# Patient Record
Sex: Male | Born: 1960 | Hispanic: No | Smoking: Former smoker
Health system: Southern US, Community
[De-identification: ages and names within clinical notes are randomized; demographics above are authoritative.]

## PROBLEM LIST (undated history)

## (undated) DIAGNOSIS — E119 Type 2 diabetes mellitus without complications: Secondary | ICD-10-CM

## (undated) DIAGNOSIS — B1920 Unspecified viral hepatitis C without hepatic coma: Secondary | ICD-10-CM

## (undated) DIAGNOSIS — E7800 Pure hypercholesterolemia, unspecified: Secondary | ICD-10-CM

## (undated) DIAGNOSIS — I10 Essential (primary) hypertension: Secondary | ICD-10-CM

## (undated) HISTORY — PX: OTHER SURGICAL HISTORY: SHX169

---

## 2003-10-08 ENCOUNTER — Other Ambulatory Visit: Payer: Self-pay

## 2007-10-24 ENCOUNTER — Ambulatory Visit: Payer: Self-pay | Admitting: Gastroenterology

## 2007-11-05 ENCOUNTER — Ambulatory Visit: Payer: Self-pay | Admitting: Gastroenterology

## 2008-12-22 ENCOUNTER — Inpatient Hospital Stay: Payer: Self-pay | Admitting: Orthopedic Surgery

## 2009-01-06 ENCOUNTER — Ambulatory Visit: Payer: Self-pay | Admitting: Orthopedic Surgery

## 2009-01-07 ENCOUNTER — Inpatient Hospital Stay: Payer: Self-pay | Admitting: Orthopedic Surgery

## 2009-05-06 ENCOUNTER — Ambulatory Visit: Payer: Self-pay | Admitting: Orthopedic Surgery

## 2009-05-06 ENCOUNTER — Ambulatory Visit: Payer: Self-pay | Admitting: Cardiovascular Disease

## 2009-05-06 ENCOUNTER — Emergency Department: Payer: Self-pay | Admitting: Emergency Medicine

## 2009-05-13 ENCOUNTER — Ambulatory Visit: Payer: Self-pay | Admitting: Orthopedic Surgery

## 2009-06-10 ENCOUNTER — Ambulatory Visit: Payer: Self-pay | Admitting: Orthopedic Surgery

## 2009-12-15 ENCOUNTER — Ambulatory Visit: Payer: Self-pay | Admitting: Orthopedic Surgery

## 2009-12-21 ENCOUNTER — Ambulatory Visit: Payer: Self-pay | Admitting: Orthopedic Surgery

## 2010-05-07 ENCOUNTER — Observation Stay: Payer: Self-pay | Admitting: Internal Medicine

## 2010-07-19 IMAGING — CR DG TIBIA/FIBULA 2V*L*
1 series · 4 of 4 positions shown · non-contrast
Comparison: none

REASON FOR EXAM: pain trauma
COMMENTS:

PROCEDURE:     DXR - DXR TIBIA AND FIBULA LT (LOWER L  - December 22, 2008 [DATE]
RESULT:     Comminuted, proximal fibular fracture is appreciated which
appears to be nondisplaced. There are findings which appear to represent an
exostosis along the lateral tibial plateau.

[Series 1: view not recorded · 0.17mm/px · 4 of 4 slices shown]
[im 1/4]
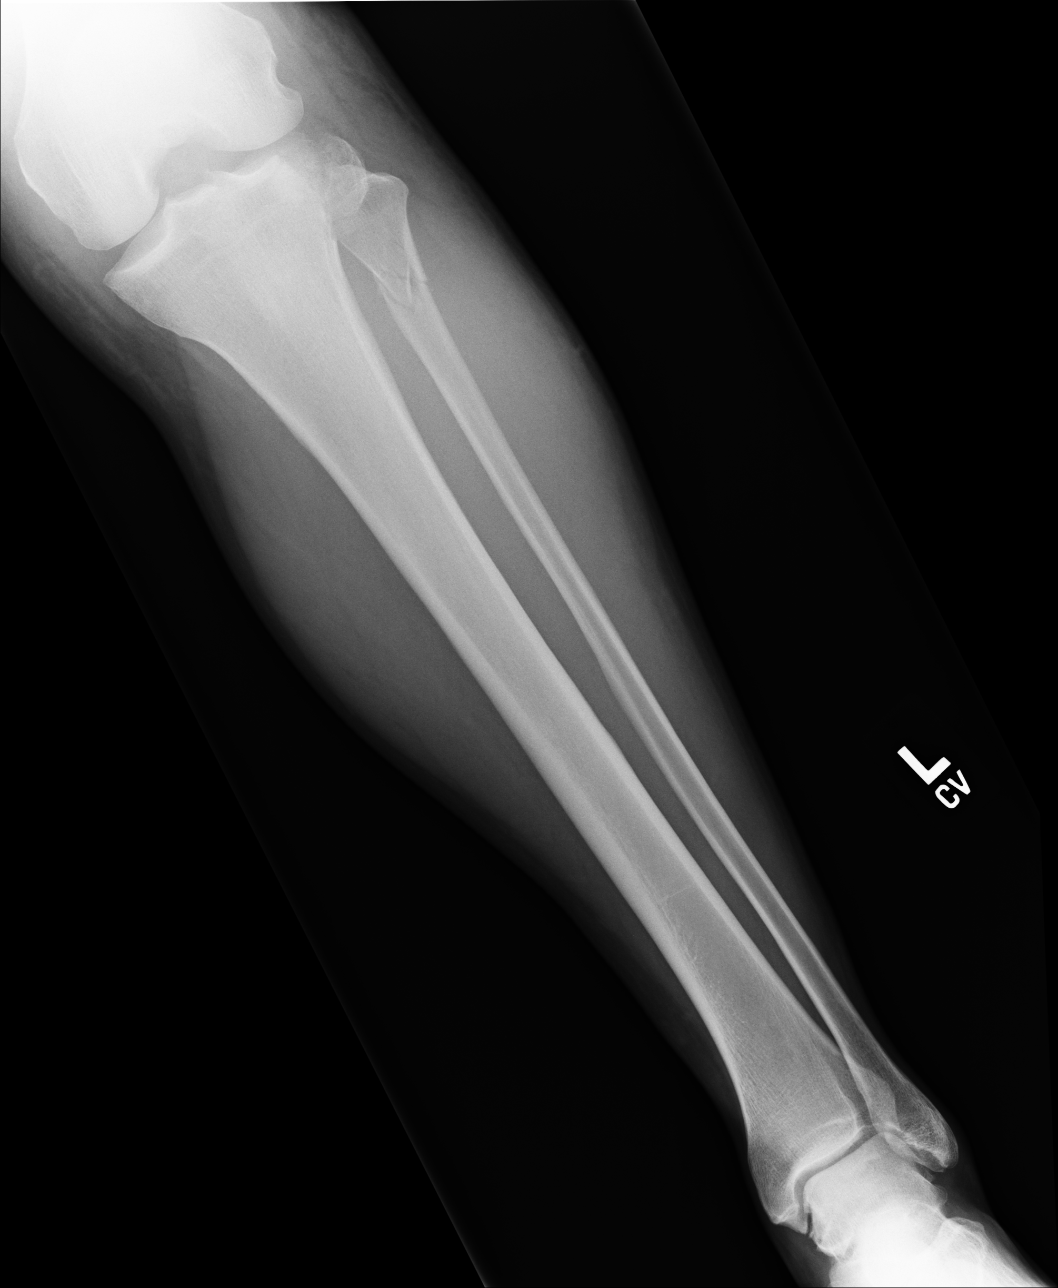
[im 2/4]
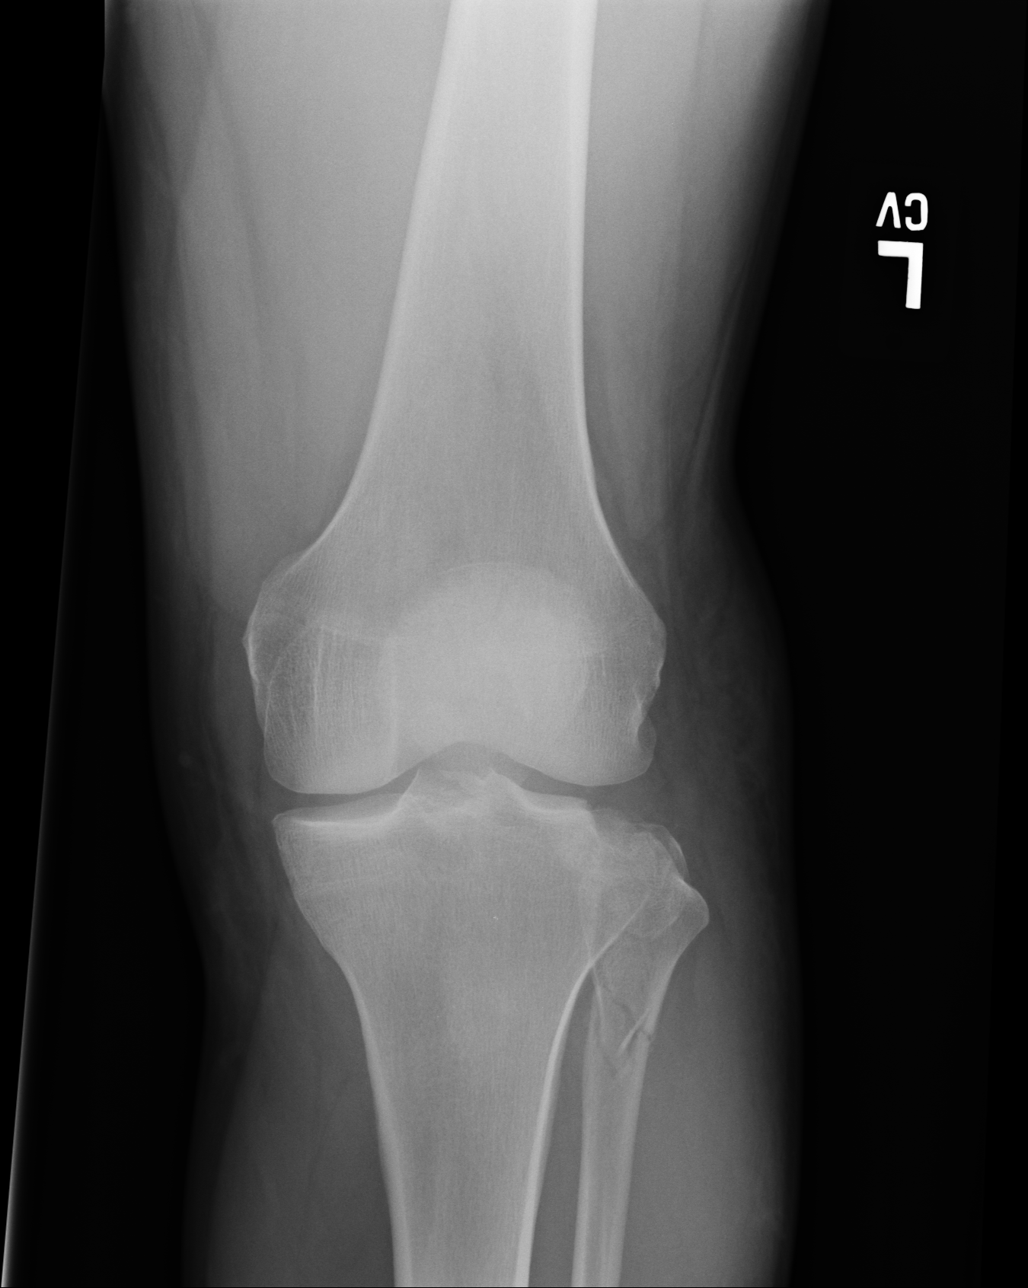
[im 3/4]
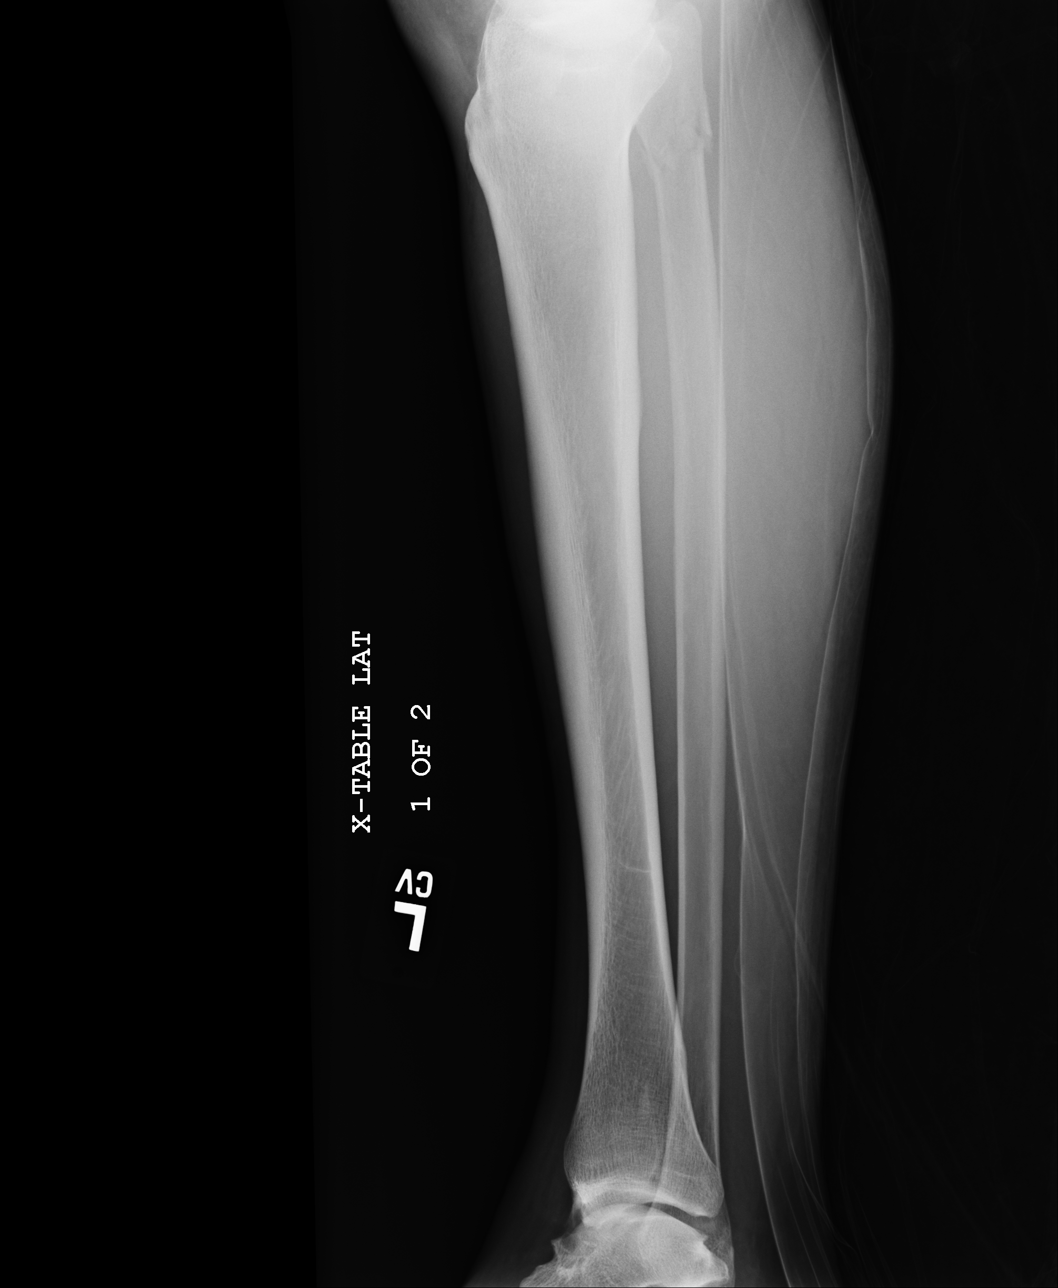
[im 4/4]
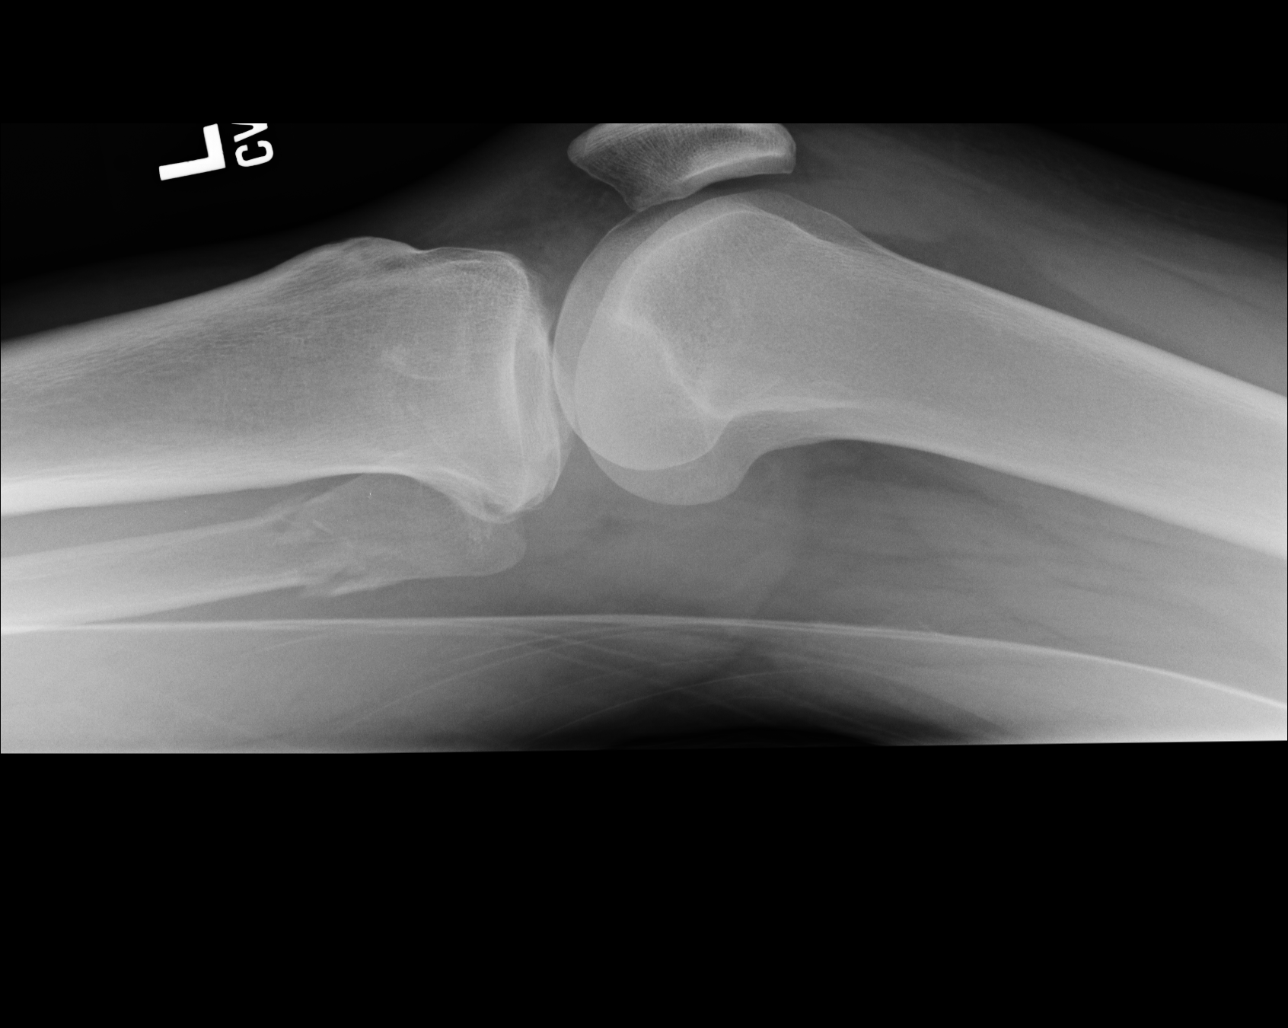

[4 of 4 positions shown; findings below may reference images not displayed]

IMPRESSION: Proximal fibular fracture as described above.

## 2010-07-19 IMAGING — CT CT OF THE LEFT KNEE WITHOUT CONTRAST
2 series · 15 of 20 positions shown, 18 images · non-contrast
Comparison: none

REASON FOR EXAM: knee pain   700 pound crush to knee
COMMENTS:

[Series 2: extremity 3.0 b70s · axial · 0.33mm/px · z∈[+42,+174]mm · 12 of 54 slices shown, 15 images]
[im 5/54  soft-tissue]
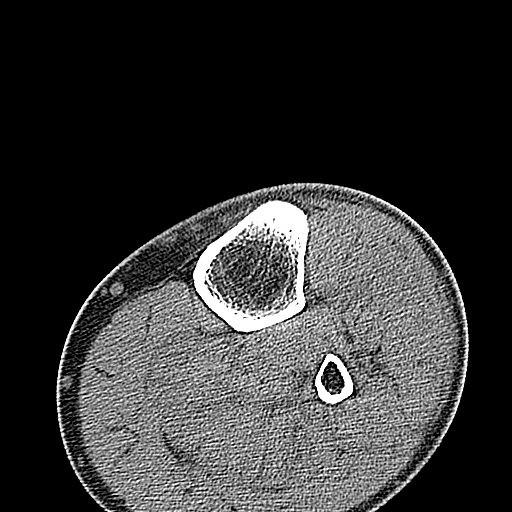
[im 5/54  bone]
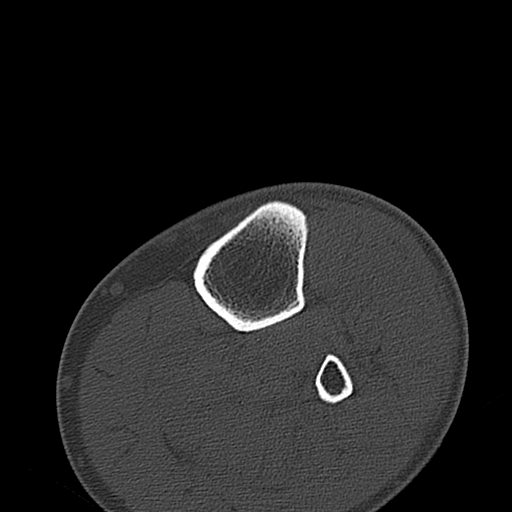
[im 9/54  bone]
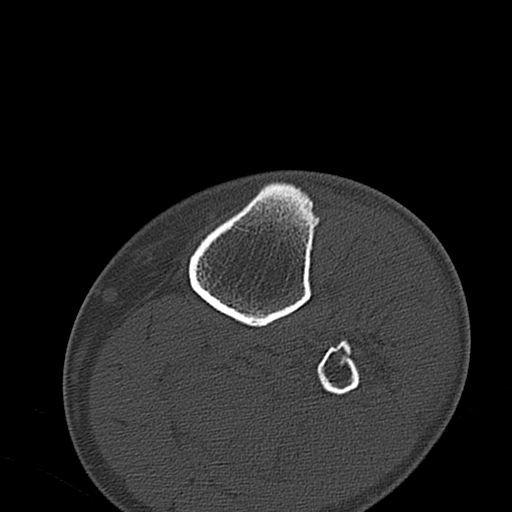
[im 13/54  bone]
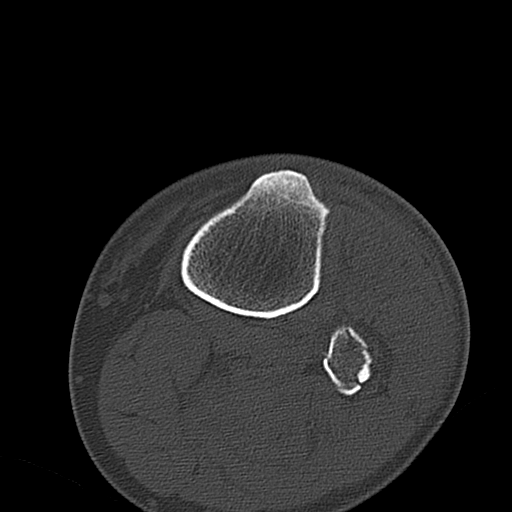
[im 17/54  bone]
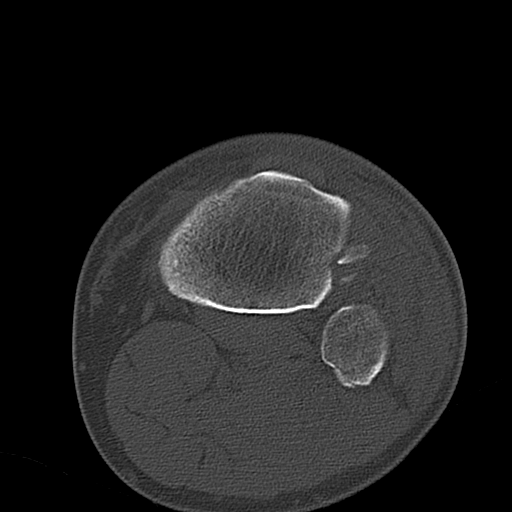
[im 21/54  soft-tissue]
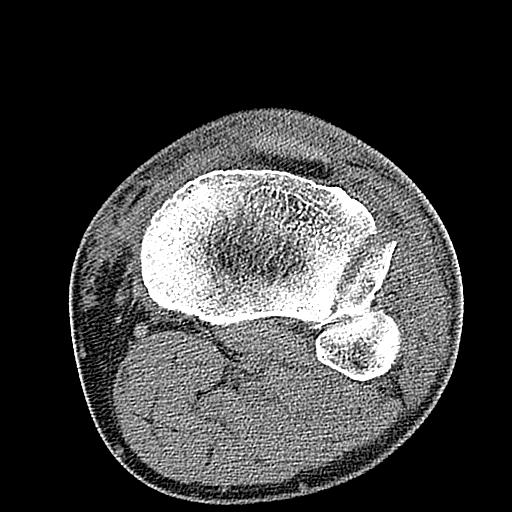
[im 21/54  bone]
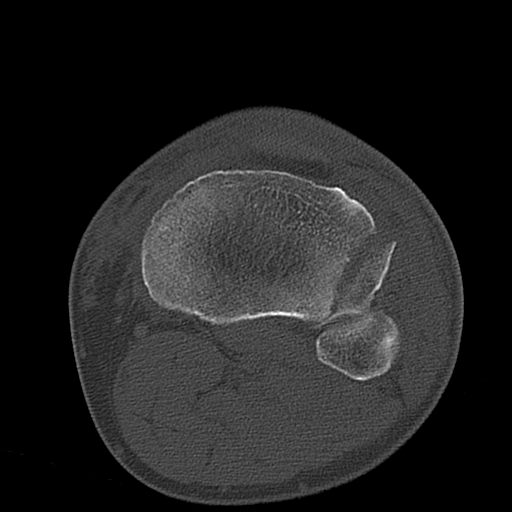
[im 25/54  bone]
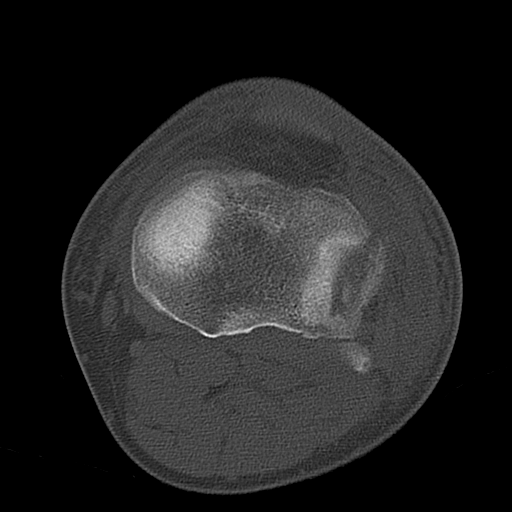
[im 29/54  bone]
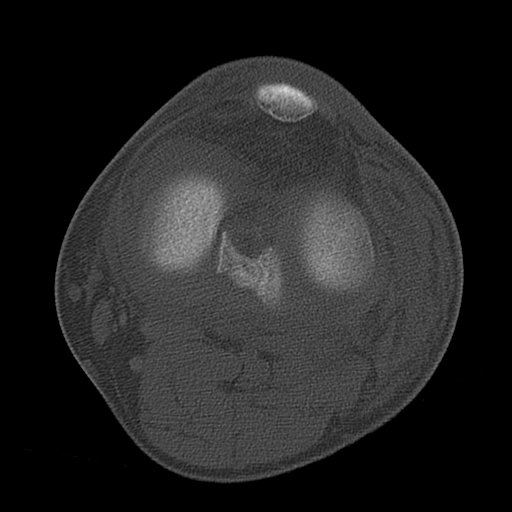
[im 33/54  bone]
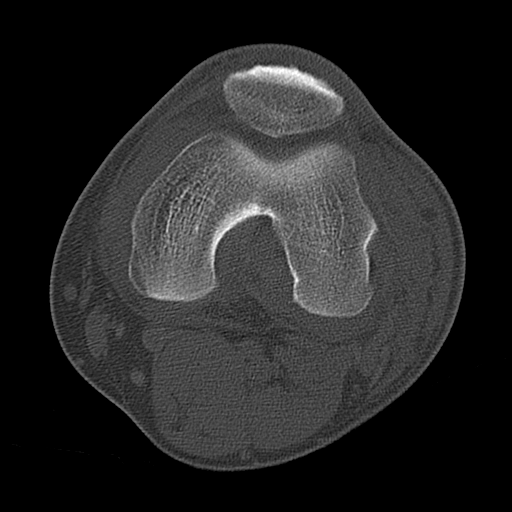
[im 37/54  soft-tissue]
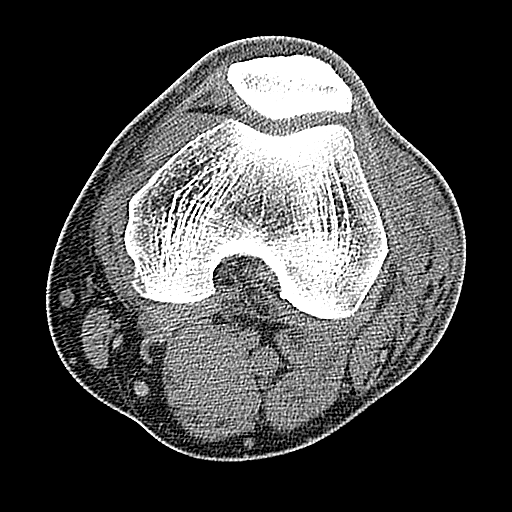
[im 37/54  bone]
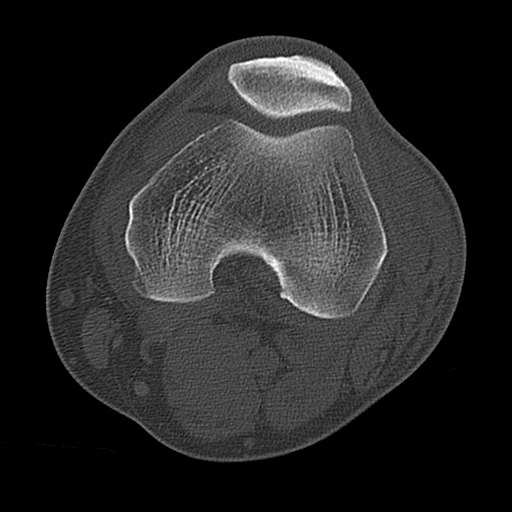
[im 41/54  bone]
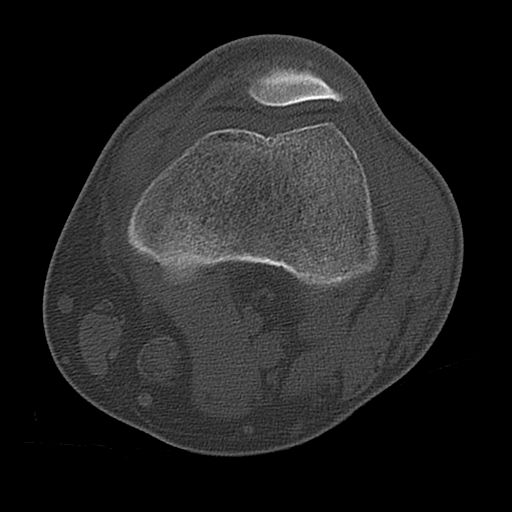
[im 45/54  bone]
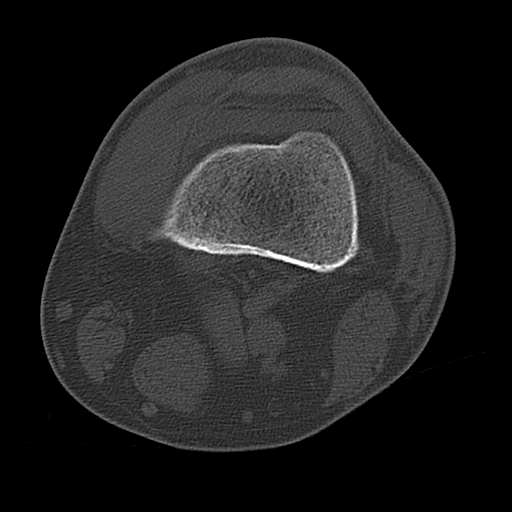
[im 49/54  bone]
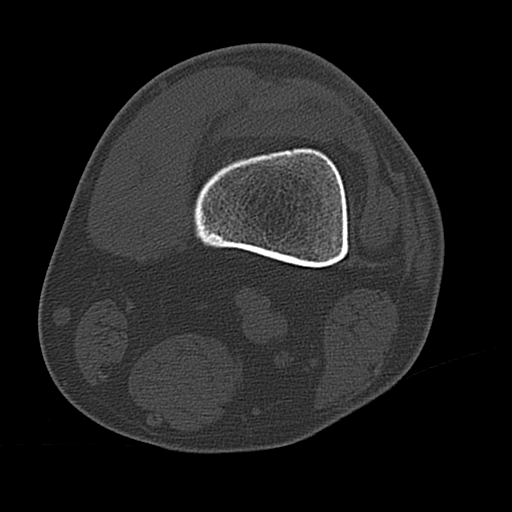

[Series 5: coronal · coronal · 0.35mm/px · 3 of 55 slices shown]
[im 11/55  bone]
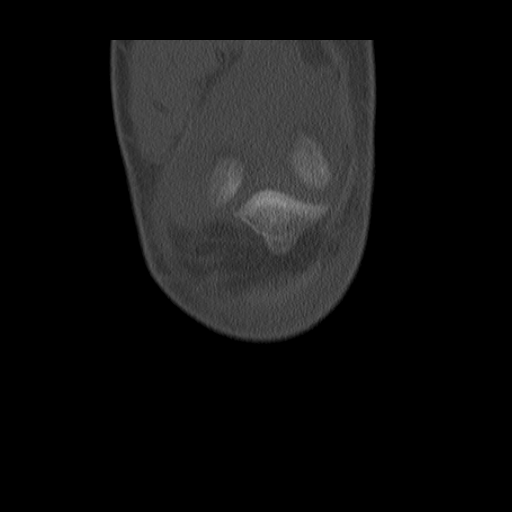
[im 22/55  bone]
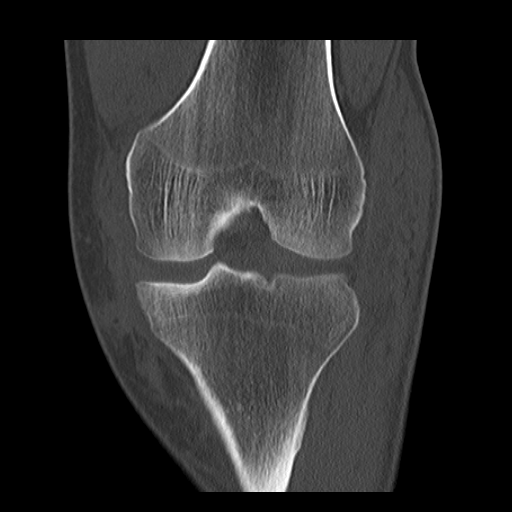
[im 33/55  bone]
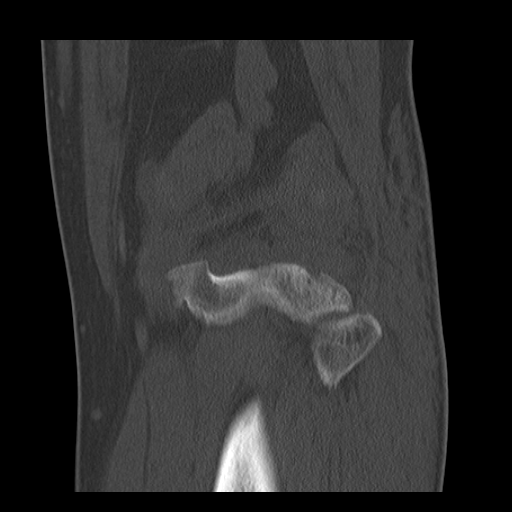

[15 of 20 positions shown; findings below may reference images not displayed]

PROCEDURE:     CT  - CT KNEE LEFT WO  - December 22, 2008 [DATE]

RESULT:     The multiplanar reconstructions are performed following
multislice helical acquisition through the left knee. There is fracture
along the lateral and posterior aspect of the lateral tibial plateau with a
primarily vertical and anterior to posterior orientation. There is
distraction of fracture fragments laterally by approximately 8 mm. There is
discontinuity in the tibial plateau. There is a proximal left fibular
fracture. There does not appear to be significant fibular distraction. There
is some minimal comminution in the fibular and tibial fractures. The distal
femur appears intact. The patella appears intact. No additional fracture is
evident.
IMPRESSION: 1. Fractures in the lateral tibial plateau lateral region with a primarily
anterior-posterior and vertical orientation with distraction and mild
comminution. Fracture also in the proximal left fibula as described.

## 2010-11-28 ENCOUNTER — Ambulatory Visit: Payer: Self-pay | Admitting: Orthopedic Surgery

## 2010-12-06 ENCOUNTER — Inpatient Hospital Stay: Payer: Self-pay | Admitting: Orthopedic Surgery

## 2010-12-08 LAB — PATHOLOGY REPORT

## 2012-05-20 ENCOUNTER — Ambulatory Visit: Payer: Self-pay

## 2015-11-25 ENCOUNTER — Emergency Department: Payer: Self-pay

## 2015-11-25 ENCOUNTER — Inpatient Hospital Stay
Admission: EM | Admit: 2015-11-25 | Discharge: 2015-11-27 | DRG: 370 | Disposition: A | Discharge status: Home / Self Care | Payer: Self-pay | Attending: Internal Medicine | Admitting: Internal Medicine

## 2015-11-25 DIAGNOSIS — Z96652 Presence of left artificial knee joint: Secondary | ICD-10-CM | POA: Diagnosis present

## 2015-11-25 DIAGNOSIS — K922 Gastrointestinal hemorrhage, unspecified: Secondary | ICD-10-CM | POA: Diagnosis present

## 2015-11-25 DIAGNOSIS — Z6830 Body mass index (BMI) 30.0-30.9, adult: Secondary | ICD-10-CM

## 2015-11-25 DIAGNOSIS — K921 Melena: Secondary | ICD-10-CM | POA: Insufficient documentation

## 2015-11-25 DIAGNOSIS — Z9119 Patient's noncompliance with other medical treatment and regimen: Secondary | ICD-10-CM

## 2015-11-25 DIAGNOSIS — K920 Hematemesis: Secondary | ICD-10-CM | POA: Insufficient documentation

## 2015-11-25 DIAGNOSIS — K2970 Gastritis, unspecified, without bleeding: Secondary | ICD-10-CM | POA: Diagnosis present

## 2015-11-25 DIAGNOSIS — R739 Hyperglycemia, unspecified: Secondary | ICD-10-CM

## 2015-11-25 DIAGNOSIS — I10 Essential (primary) hypertension: Secondary | ICD-10-CM | POA: Diagnosis present

## 2015-11-25 DIAGNOSIS — B1920 Unspecified viral hepatitis C without hepatic coma: Secondary | ICD-10-CM | POA: Diagnosis present

## 2015-11-25 DIAGNOSIS — E1165 Type 2 diabetes mellitus with hyperglycemia: Secondary | ICD-10-CM | POA: Diagnosis present

## 2015-11-25 DIAGNOSIS — K226 Gastro-esophageal laceration-hemorrhage syndrome: Principal | ICD-10-CM | POA: Diagnosis present

## 2015-11-25 DIAGNOSIS — E7800 Pure hypercholesterolemia, unspecified: Secondary | ICD-10-CM | POA: Diagnosis present

## 2015-11-25 DIAGNOSIS — F17210 Nicotine dependence, cigarettes, uncomplicated: Secondary | ICD-10-CM | POA: Diagnosis present

## 2015-11-25 DIAGNOSIS — K2980 Duodenitis without bleeding: Secondary | ICD-10-CM | POA: Diagnosis present

## 2015-11-25 HISTORY — DX: Unspecified viral hepatitis C without hepatic coma: B19.20

## 2015-11-25 HISTORY — DX: Type 2 diabetes mellitus without complications: E11.9

## 2015-11-25 HISTORY — DX: Essential (primary) hypertension: I10

## 2015-11-25 HISTORY — DX: Pure hypercholesterolemia, unspecified: E78.00

## 2015-11-25 LAB — URINALYSIS COMPLETE WITH MICROSCOPIC (ARMC ONLY)
BACTERIA UA: NONE SEEN
Bilirubin Urine: NEGATIVE
Glucose, UA: >500 mg/dL — AB
HGB URINE DIPSTICK: NEGATIVE
KETONES UR: NEGATIVE mg/dL
LEUKOCYTES UA: NEGATIVE
NITRITE: NEGATIVE
PH: 5 (ref 5.0–8.0)
PROTEIN: NEGATIVE mg/dL
SPECIFIC GRAVITY, URINE: 1.026 (ref 1.005–1.030)

## 2015-11-25 LAB — COMPREHENSIVE METABOLIC PANEL
ALT: 56 U/L (ref 17–63)
ANION GAP: 11 (ref 5–15)
AST: 38 U/L (ref 15–41)
Albumin: 4 g/dL (ref 3.5–5.0)
Alkaline Phosphatase: 91 U/L (ref 38–126)
BILIRUBIN TOTAL: 0.9 mg/dL (ref 0.3–1.2)
BUN: 41 mg/dL — AB (ref 6–20)
CHLORIDE: 100 mmol/L — AB (ref 101–111)
CO2: 23 mmol/L (ref 22–32)
Calcium: 9 mg/dL (ref 8.9–10.3)
Creatinine, Ser: 1.07 mg/dL (ref 0.61–1.24)
Glucose, Bld: 504 mg/dL (ref 65–99)
POTASSIUM: 4.7 mmol/L (ref 3.5–5.1)
Sodium: 134 mmol/L — ABNORMAL LOW (ref 135–145)
TOTAL PROTEIN: 7.5 g/dL (ref 6.5–8.1)

## 2015-11-25 LAB — CBC WITH DIFFERENTIAL/PLATELET
BASOS ABS: 0.1 10*3/uL (ref 0–0.1)
Basophils Relative: 0 %
EOS PCT: 0 %
Eosinophils Absolute: 0 10*3/uL (ref 0–0.7)
HCT: 41.7 % (ref 40.0–52.0)
HEMOGLOBIN: 14.1 g/dL (ref 13.0–18.0)
LYMPHS ABS: 2.3 10*3/uL (ref 1.0–3.6)
LYMPHS PCT: 13 %
MCH: 29.4 pg (ref 26.0–34.0)
MCHC: 33.8 g/dL (ref 32.0–36.0)
MCV: 87 fL (ref 80.0–100.0)
Monocytes Absolute: 1.1 10*3/uL — ABNORMAL HIGH (ref 0.2–1.0)
Monocytes Relative: 6 %
NEUTROS ABS: 13.8 10*3/uL — AB (ref 1.4–6.5)
NEUTROS PCT: 81 %
PLATELETS: 325 10*3/uL (ref 150–440)
RBC: 4.79 MIL/uL (ref 4.40–5.90)
RDW: 12.9 % (ref 11.5–14.5)
WBC: 17.1 10*3/uL — AB (ref 3.8–10.6)

## 2015-11-25 LAB — ABO/RH: ABO/RH(D): A NEG

## 2015-11-25 LAB — PROTIME-INR
INR: 1.12
PROTHROMBIN TIME: 14.6 s (ref 11.4–15.0)

## 2015-11-25 LAB — LIPASE, BLOOD: Lipase: 14 U/L (ref 11–51)

## 2015-11-25 LAB — TYPE AND SCREEN
ABO/RH(D): A NEG
Antibody Screen: NEGATIVE

## 2015-11-25 LAB — GLUCOSE, CAPILLARY: Glucose-Capillary: 274 mg/dL — ABNORMAL HIGH (ref 65–99)

## 2015-11-25 LAB — APTT: aPTT: <24 seconds — ABNORMAL LOW (ref 24–36)

## 2015-11-25 MED ORDER — MORPHINE SULFATE (PF) 4 MG/ML IV SOLN
4.0000 mg | Freq: Once | INTRAVENOUS | Status: AC
  Administered 2015-11-25: 4 mg via INTRAVENOUS
  Filled 2015-11-25: qty 1

## 2015-11-25 MED ORDER — ONDANSETRON HCL 4 MG/2ML IJ SOLN
4.0000 mg | Freq: Once | INTRAMUSCULAR | Status: AC
  Administered 2015-11-25: 4 mg via INTRAVENOUS
  Filled 2015-11-25: qty 2

## 2015-11-25 MED ORDER — SODIUM CHLORIDE 0.9 % IV BOLUS (SEPSIS)
1000.0000 mL | INTRAVENOUS | Status: AC
  Administered 2015-11-25: 1000 mL via INTRAVENOUS

## 2015-11-25 MED ORDER — SODIUM CHLORIDE 0.9 % IV BOLUS (SEPSIS)
1000.0000 mL | Freq: Once | INTRAVENOUS | Status: AC
  Administered 2015-11-25: 1000 mL via INTRAVENOUS

## 2015-11-25 MED ORDER — PANTOPRAZOLE SODIUM 40 MG IV SOLR
80.0000 mg | Freq: Once | INTRAVENOUS | Status: AC
  Administered 2015-11-25: 80 mg via INTRAVENOUS
  Filled 2015-11-25: qty 80

## 2015-11-25 MED ORDER — HYDRALAZINE HCL 20 MG/ML IJ SOLN
10.0000 mg | Freq: Four times a day (QID) | INTRAMUSCULAR | Status: DC | PRN
Start: 2015-11-25 — End: 2015-11-27

## 2015-11-25 MED ORDER — SODIUM CHLORIDE 0.9 % IV SOLN
3.0000 g | INTRAVENOUS | Status: AC
  Administered 2015-11-25: 3 g via INTRAVENOUS
  Filled 2015-11-25: qty 3

## 2015-11-25 MED ORDER — INSULIN ASPART 100 UNIT/ML ~~LOC~~ SOLN
20.0000 [IU] | Freq: Once | SUBCUTANEOUS | Status: AC
  Administered 2015-11-25: 20 [IU] via SUBCUTANEOUS
  Filled 2015-11-25: qty 20

## 2015-11-25 MED ORDER — SODIUM CHLORIDE 0.9 % IV SOLN
8.0000 mg/h | INTRAVENOUS | Status: DC
  Administered 2015-11-25 – 2015-11-26 (×3): 8 mg/h via INTRAVENOUS
  Filled 2015-11-25 (×3): qty 80

## 2015-11-25 MED ORDER — SODIUM CHLORIDE 0.9 % IV SOLN
INTRAVENOUS | Status: DC
  Administered 2015-11-25: 21:00:00 via INTRAVENOUS

## 2015-11-25 NOTE — ED Notes (Signed)
Pharmacy called and made aware of need of Unasyn. States they will send it.

## 2015-11-25 NOTE — ED Notes (Signed)
Patient transported to X-ray 

## 2015-11-25 NOTE — H&P (Signed)
[facility] [place] NAME: Adam Thomas    MR#:  [id number]  DATE OF BIRTH:  [birth year]  DATE OF ADMISSION:  11/25/2015  PRIMARY CARE PHYSICIAN: No primary care provider on file.   REQUESTING/REFERRING PHYSICIAN: dr Karma Greaser  CHIEF COMPLAINT:   Vomiting maroon blood since last night 3 so far and black tarry stools HISTORY OF PRESENT ILLNESS:  Adam Thomas  is a 55 y.o. male with a known history of hepatitis C, hypertension, type 2 diabetes, tobacco abuse not taking any medications or following up with primary care physician comes to the emergency room after he had some pork chops last night. Patient started vomiting at 2 AM and had vomited blood at work second time. Came home went to bed and woke up and in the evening around 8:30 had another episode of dark maroon vomiting. He started having or should have a bowel movement and noticed black tarry stools. Came to the emergency room he is hemodynamically stable with hemoglobin normal hemoglobin how were is being admitted now with a GI bleed for further evaluation management. Patient currently is off Protonix drip. Patient reports taking Aleve PM 3 tablets daily at bedtime. Denies any other over-the-counter or any other end-stage use.   PAST MEDICAL HISTORY:   Past Medical History  Diagnosis Date  . Diabetes mellitus without complication ([place])   . Hypertension   . Hepatitis C   . High cholesterol     PAST SURGICAL HISTOIRY:   Past Surgical History  Procedure Laterality Date  . Left knee replacement Left     SOCIAL HISTORY:   Social History  Substance Use Topics  . Smoking status: Current Every Day Smoker -- 1.00 packs/day for 35 years    Types: Cigarettes  . Smokeless tobacco: Not on file  . Alcohol Use: No    FAMILY HISTORY:  No family history on file.  DRUG ALLERGIES:  No Known Allergies  REVIEW OF SYSTEMS:  Review of Systems  Constitutional: Negative for  fever, chills and weight loss.  HENT: Negative for ear discharge, ear pain and nosebleeds.   Eyes: Negative for blurred vision, pain and discharge.  Respiratory: Negative for sputum production, shortness of breath, wheezing and stridor.   Cardiovascular: Negative for chest pain, palpitations, orthopnea and PND.  Gastrointestinal: Positive for vomiting, abdominal pain, blood in stool and melena. Negative for nausea and diarrhea.  Genitourinary: Negative for urgency and frequency.  Musculoskeletal: Negative for back pain and joint pain.  Neurological: Positive for weakness. Negative for sensory change, speech change and focal weakness.  Psychiatric/Behavioral: Negative for depression and hallucinations. The patient is not nervous/anxious.   All other systems reviewed and are negative.    MEDICATIONS AT HOME:   Prior to Admission medications   Not on File      VITAL SIGNS:  Blood pressure 140/100, pulse 102, temperature 98.3 F (36.8 C), temperature source Oral, resp. rate 20, SpO2 98 %.  PHYSICAL EXAMINATION:  GENERAL:  55 y.o.-year-old patient lying in the bed with no acute distress. Morbid obesity  EYES: Pupils equal, round, reactive to light and accommodation. No scleral icterus. Extraocular muscles intact.  HEENT: Head atraumatic, normocephalic. Oropharynx and nasopharynx clear.  NECK:  Supple, no jugular venous distention. No thyroid enlargement, no tenderness.  LUNGS: Normal breath sounds bilaterally, no wheezing, rales,rhonchi or crepitation. No use of accessory muscles of respiration.  CARDIOVASCULAR: S1, S2 normal. No murmurs, rubs, or gallops.  ABDOMEN: Soft, nontender, nondistended.  Bowel sounds present. No organomegaly or mass.  EXTREMITIES: No pedal edema, cyanosis, or clubbing.  NEUROLOGIC: Cranial nerves II through XII are intact. Muscle strength 5/5 in all extremities. Sensation intact. Gait not checked.  PSYCHIATRIC: The patient is alert and oriented x 3.  SKIN: No  obvious rash, lesion, or ulcer.   LABORATORY PANEL:   CBC  Recent Labs Lab 11/25/15 1954  WBC 17.1*  HGB 14.1  HCT 41.7  PLT 325   ------------------------------------------------------------------------------------------------------------------  Chemistries   Recent Labs Lab 11/25/15 1954  NA 134*  K 4.7  CL 100*  CO2 23  GLUCOSE 504*  BUN 41*  CREATININE 1.07  CALCIUM 9.0  AST 38  ALT 56  ALKPHOS 91  BILITOT 0.9   ------------------------------------------------------------------------------------------------------------------  Cardiac Enzymes No results for input(s): TROPONINI in the last 168 hours. ------------------------------------------------------------------------------------------------------------------  RADIOLOGY:  Dg Abd Acute W/chest  11/25/2015  CLINICAL DATA:  Acute onset of left lower quadrant abdominal pain and hematemesis. Dark tarry diarrhea. Piece of pork chop stuck in esophagus. Initial encounter. EXAM: DG ABDOMEN ACUTE W/ 1V CHEST COMPARISON:  Chest radiograph performed 11/28/2010 FINDINGS: The lungs are well-aerated. Scattered calcified granulomata within the lungs reflect remote granulomatous disease. Mild peribronchial thickening is noted. There is no evidence of focal opacification, pleural effusion or pneumothorax. The cardiomediastinal silhouette is within normal limits. The visualized bowel gas pattern is unremarkable. The colon is largely decompressed; there is no evidence of small bowel dilatation to suggest obstruction. No free intra-abdominal air is identified on the provided upright view. No acute osseous abnormalities are seen; the sacroiliac joints are unremarkable in appearance. No radiopaque foreign bodies are identified. A piece of pork chop is unlikely to be visible on radiograph. IMPRESSION: 1. Unremarkable bowel gas pattern; no free intra-abdominal air seen. Colon largely decompressed and grossly unremarkable. 2. No acute  cardiopulmonary process seen. Mild peribronchial thickening noted. Electronically Signed   By: Garald Balding M.D.   On: 11/25/2015 22:30    EKG:   Sinus tachycardia, RVH  IMPRESSION AND PLAN:   Adam Thomas  is a 55 y.o. male with a known history of hepatitis C, hypertension, type 2 diabetes, tobacco abuse not taking any medications or following up with primary care physician comes to the emergency room after he had some pork chops last night. Patient started vomiting at 2 AM and had vomited blood at work second time. Came home went to bed and woke up and in the evening around 8:30 had another episode of dark maroon vomiting. He started having or should have a bowel movement and noticed black tarry stools.  1. GI bleed appears upper given history of hematemesis and melanotic stools -Could be NSAID related peptic ulcer disease versus esophageal versus secondary to possibly as hepatitis C related liver issues -No GI bleed in the past -Admit to medical floor -Monitor H&H closely. Transfuse as needed.  -Continue IV Protonix drip. GI consultation. - Avoid NSAIDs aspirin or antiplatelet agents.  2. Uncontrolled type 2 diabetes secondary to medical noncompliance -We'll start patient on high-dose sliding scale insulin is nothing by mouth and supported off on oral meds. Patient may require insulin for his treatment. -Diabetes coordinator to see -Hemoglobin A1c  3. Hypertension again patient is not on any medications -He will need to be started on antihypertensives -I will start him on IV hydralazine when necessary for now since his nothing by mouth  4. Tobacco abuse smoking cessation advise for minutes spent  5. DVT prophylaxis SCDs and teds  Above was discussed with patient and his daughter   All the records are reviewed and case discussed with ED provider. Management plans discussed with the patient, family and they are in agreement.  CODE STATUS: *Full  TOTAL TIME TAKING CARE OF  THIS PATIENT:50 minutes.    Lakechia Nay M.D on 11/25/2015 at 10:44 PM  Between 7am to 6pm - Pager - [phone]  After 6pm go to [email or web address] - password [email or web address]  [facility]  Office  [phone]  CC: Primary care physician; No primary care provider on file.

## 2015-11-25 NOTE — ED Provider Notes (Signed)
[facility] Emergency Department Provider Note  ____________________________________________  Time seen: Approximately 7:46 PM  I have reviewed the triage vital signs and the nursing notes.   HISTORY  Chief Complaint Emesis    HPI Adam Thomas is a 55 y.o. male with a prior medical history of hepatitis C, uncomplicated diabetes, hypertension, high cholesterol, and he does not have a regular doctor.  He smokes a pack a day but he says that he has not used alcohol for 2-3 decades.  He presents today with acute onset of Mrs. last night which continued today and then was followed by copious black and tarry stools.  He is feeling generalized weakness and feels like his heart is beating rapidly.  He denies chest pain and shortness of breath.  He has some moderate aching abdominal pain in the center of his abdomen.  Nothing is making his symptoms better or worse.  He has not had any adjacent eating anything today although he has been able to drink a little bit.  He reports that the symptoms started last night when he ate a piece of pork chop and feels like it got stuck in his esophagus.  For a couple of hours he felt like he was unable to pass and he was spitting backup fluids when he was trying to drink.  He felt like it went down slowly over time but before he went to bed he felt acutely nauseated and he vomited bright red blood.  He went to sleep and then it happened again when he woke up this morning.  Overall he describes symptoms as severe.It was after this that the multiple episodes of loose dark and tarry stools began.  He has some mild dull aching abdominal pain but it is minimal.  He denies chest pain, shortness of breath, lightheadedness, dizziness.  Nothing is making his symptoms better or worse.  They are relatively acute in onset.   Past Medical History  Diagnosis Date  . Diabetes mellitus without complication ([place])   . Hypertension   . Hepatitis C   .  High cholesterol     Patient Active Problem List   Diagnosis Date Noted  . GI bleed 11/25/2015    Past Surgical History  Procedure Laterality Date  . Left knee replacement Left     No current outpatient prescriptions on file.  Allergies Review of patient's allergies indicates no known allergies.  History reviewed. No pertinent family history.  Social History Social History  Substance Use Topics  . Smoking status: Current Every Day Smoker -- 1.00 packs/day for 35 years    Types: Cigarettes  . Smokeless tobacco: None  . Alcohol Use: No    Review of Systems Constitutional: No fever/chills Eyes: No visual changes. ENT: No sore throat. Cardiovascular: Denies chest pain. Respiratory: Denies shortness of breath. Gastrointestinal: dull central abdominal pain.  Vomiting bright red blood and having loose dark tarry stools Genitourinary: Negative for dysuria. Musculoskeletal: Negative for back pain. Skin: red rash on right upper thigh/groin Neurological: Negative for headaches, focal weakness or numbness.  10-point ROS otherwise negative.  ____________________________________________   PHYSICAL EXAM:  VITAL SIGNS: ED Triage Vitals  Enc Vitals Group     BP 11/25/15 1918 131/95 mmHg     Pulse Rate 11/25/15 1918 123     Resp 11/25/15 1918 22     Temp 11/25/15 1918 98.3 F (36.8 C)     Temp Source 11/25/15 1918 Oral     SpO2 11/25/15 1918  97 %     Weight --      Height --      Head Cir --      Peak Flow --      Pain Score 11/25/15 1922 6     Pain Loc --      Pain Edu? --      Excl. in [place]? --     Constitutional: Alert and oriented.  Somewhat ill appearing but in no acute distress.  Tachycardia. Eyes: Conjunctivae are normal. PERRL. EOMI. mild scleral icterus which his daughter states is normal Head: Atraumatic. Nose: No congestion/rhinnorhea. Mouth/Throat: Mucous membranes are moist.  Oropharynx non-erythematous. Neck: No stridor.   Cardiovascular:  Tachycardia, regular rhythm. Grossly normal heart sounds.  Good peripheral circulation. Respiratory: Normal respiratory effort.  No retractions. Lungs CTAB.  No crepitus of the chest wall. Gastrointestinal: Soft with minimal tenderness to palpation throughout but no focal tenderness, no rebound, no guarding, and no firmness or tympany to suggest abdominal perforation. Genitourinary: Fungal rash to right upper thigh in intertriginous area adjacent to scrotum. Musculoskeletal: No lower extremity tenderness nor edema.  No joint effusions. Neurologic:  Normal speech and language. No gross focal neurologic deficits are appreciated.  Skin:  Skin is warm, dry and intact. No rash noted.  Pale Psychiatric: Mood and affect are normal. Speech and behavior are normal.  ____________________________________________   LABS (all labs ordered are listed, but only abnormal results are displayed)  Labs Reviewed  COMPREHENSIVE METABOLIC PANEL - Abnormal; Notable for the following:    Sodium 134 (*)    Chloride 100 (*)    Glucose, Bld 504 (*)    BUN 41 (*)    All other components within normal limits  CBC WITH DIFFERENTIAL/PLATELET - Abnormal; Notable for the following:    WBC 17.1 (*)    Neutro Abs 13.8 (*)    Monocytes Absolute 1.1 (*)    All other components within normal limits  APTT - Abnormal; Notable for the following:    aPTT <24 (*)    All other components within normal limits  URINALYSIS COMPLETEWITH MICROSCOPIC ([facility] ONLY) - Abnormal; Notable for the following:    Color, Urine YELLOW (*)    APPearance CLEAR (*)    Glucose, UA >500 (*)    Squamous Epithelial / LPF 0-5 (*)    All other components within normal limits  GLUCOSE, CAPILLARY - Abnormal; Notable for the following:    Glucose-Capillary 274 (*)    All other components within normal limits  LIPASE, BLOOD  PROTIME-INR  CBC  CBC  CREATININE, SERUM  HEMOGLOBIN A1C  TYPE AND SCREEN  ABO/RH    ____________________________________________  EKG  ED ECG REPORT I, Mahlet Jergens, the attending physician, personally viewed and interpreted this ECG.   Date: 11/25/2015  EKG Time: 19:44  Rate: 114  Rhythm: sinus tachycardia  Axis: Normal  Intervals:Normal  ST&T Change: Non-specific ST segment / T-wave changes, but no evidence of acute ischemia.   ____________________________________________  RADIOLOGY   Dg Abd Acute W/chest  11/25/2015  CLINICAL DATA:  Acute onset of left lower quadrant abdominal pain and hematemesis. Dark tarry diarrhea. Piece of pork chop stuck in esophagus. Initial encounter. EXAM: DG ABDOMEN ACUTE W/ 1V CHEST COMPARISON:  Chest radiograph performed 11/28/2010 FINDINGS: The lungs are well-aerated. Scattered calcified granulomata within the lungs reflect remote granulomatous disease. Mild peribronchial thickening is noted. There is no evidence of focal opacification, pleural effusion or pneumothorax. The cardiomediastinal silhouette is within normal limits.  The visualized bowel gas pattern is unremarkable. The colon is largely decompressed; there is no evidence of small bowel dilatation to suggest obstruction. No free intra-abdominal air is identified on the provided upright view. No acute osseous abnormalities are seen; the sacroiliac joints are unremarkable in appearance. No radiopaque foreign bodies are identified. A piece of pork chop is unlikely to be visible on radiograph. IMPRESSION: 1. Unremarkable bowel gas pattern; no free intra-abdominal air seen. Colon largely decompressed and grossly unremarkable. 2. No acute cardiopulmonary process seen. Mild peribronchial thickening noted. Electronically Signed   By: Garald Balding M.D.   On: 11/25/2015 22:30    ____________________________________________   PROCEDURES  Procedure(s) performed: None  Critical Care performed: No ____________________________________________   INITIAL IMPRESSION / ASSESSMENT AND  PLAN / ED COURSE  Pertinent labs & imaging results that were available during my care of the patient were reviewed by me and considered in my medical decision making (see chart for details).  The patient has an acute GI bleed, and I suspect he ruptured and esophageal varix when he had the piece of pork chops stuck in his throat last night.  Fortunately his hemoglobin is normal at this time and I believe it will drop given the volume of blood he described and the gross melena he currently exhibits.  His acute abdomen series is unremarkable and not consistent with any free intra-abdominal air or visual perforation.  Given his white blood cell count is tachycardia and risk factors I gave him a dose of Unasyn 3 g IV.  He is not having any difficulty breathing and was tolerating liquids earlier today, and again, there is no radiographic evidence of esophageal perforation.  I gave him 2 L of IV fluids given his tachycardia and his heart rate is come down from the 130s with any amount of movement to about 100.  I also provided Protonix given the upper GI bleeding although I do suspect it is from an esophageal varix.  I will admit to the hospitalist for further management and GI evaluation.  No specific intervention is required for his hyperglycemia at this time as he is getting 2 L of fluid and has no elevated anion gap.  ____________________________________________  FINAL CLINICAL IMPRESSION(S) / ED DIAGNOSES  Final diagnoses:  Hematemesis with nausea  Melena  Hyperglycemia      NEW MEDICATIONS STARTED DURING THIS VISIT:  There are no discharge medications for this patient.     Note:  This document was prepared using Dragon voice recognition software and may include unintentional dictation errors.   Hinda Kehr, MD 11/26/15 [phone]

## 2015-11-25 NOTE — ED Notes (Signed)
Pt c/o vomiting blood x 2 this morning, since then multiple episodes of dark tarry diarrhea.  Reports that he got a piece of pork chop stuck in his esophagus last night - since then he has been tolerating fluids only but reports that every time he drinks he has a BM.

## 2015-11-25 NOTE — ED Notes (Signed)
Patient states he has been having dark, tarry bowel movements since last night. Patient also states when he vomits it is red. MD aware.

## 2015-11-25 NOTE — ED Notes (Signed)
MD made aware of glucose level.

## 2015-11-25 NOTE — ED Notes (Signed)
Pharmacy called and notified of need of protonix. States they are mixing it now and will send it up. Patient made aware.

## 2015-11-26 ENCOUNTER — Encounter
Admission: EM | Disposition: A | Discharge status: Home / Self Care | Payer: Self-pay | Source: Home / Self Care | Attending: Internal Medicine

## 2015-11-26 ENCOUNTER — Inpatient Hospital Stay: Payer: MEDICAID | Admitting: Anesthesiology

## 2015-11-26 DIAGNOSIS — K921 Melena: Secondary | ICD-10-CM

## 2015-11-26 DIAGNOSIS — R110 Nausea: Secondary | ICD-10-CM

## 2015-11-26 DIAGNOSIS — K922 Gastrointestinal hemorrhage, unspecified: Secondary | ICD-10-CM

## 2015-11-26 DIAGNOSIS — K920 Hematemesis: Secondary | ICD-10-CM

## 2015-11-26 HISTORY — PX: ESOPHAGOGASTRODUODENOSCOPY (EGD) WITH PROPOFOL: SHX5813

## 2015-11-26 LAB — CBC
HCT: 36.2 % — ABNORMAL LOW (ref 40.0–52.0)
HCT: 34.9 % — ABNORMAL LOW (ref 40.0–52.0)
HEMOGLOBIN: 12.6 g/dL — AB (ref 13.0–18.0)
Hemoglobin: 11.9 g/dL — ABNORMAL LOW (ref 13.0–18.0)
MCH: 29.6 pg (ref 26.0–34.0)
MCH: 29.3 pg (ref 26.0–34.0)
MCHC: 34.7 g/dL (ref 32.0–36.0)
MCHC: 34.1 g/dL (ref 32.0–36.0)
MCV: 85.4 fL (ref 80.0–100.0)
MCV: 86 fL (ref 80.0–100.0)
PLATELETS: 286 10*3/uL (ref 150–440)
PLATELETS: 269 10*3/uL (ref 150–440)
RBC: 4.24 MIL/uL — ABNORMAL LOW (ref 4.40–5.90)
RBC: 4.05 MIL/uL — ABNORMAL LOW (ref 4.40–5.90)
RDW: 12.9 % (ref 11.5–14.5)
RDW: 12.9 % (ref 11.5–14.5)
WBC: 19.9 10*3/uL — ABNORMAL HIGH (ref 3.8–10.6)
WBC: 17.5 10*3/uL — AB (ref 3.8–10.6)

## 2015-11-26 LAB — CREATININE, SERUM
CREATININE: 0.88 mg/dL (ref 0.61–1.24)
GFR calc Af Amer: >60 mL/min (ref >60)

## 2015-11-26 LAB — HEMOGLOBIN AND HEMATOCRIT, BLOOD
HCT: 31.1 % — ABNORMAL LOW (ref 40.0–52.0)
HEMATOCRIT: 30 % — AB (ref 40.0–52.0)
HEMOGLOBIN: 10.3 g/dL — AB (ref 13.0–18.0)
Hemoglobin: 10.5 g/dL — ABNORMAL LOW (ref 13.0–18.0)

## 2015-11-26 LAB — GLUCOSE, CAPILLARY
GLUCOSE-CAPILLARY: 163 mg/dL — AB (ref 65–99)
GLUCOSE-CAPILLARY: 149 mg/dL — AB (ref 65–99)
GLUCOSE-CAPILLARY: 156 mg/dL — AB (ref 65–99)
GLUCOSE-CAPILLARY: 137 mg/dL — AB (ref 65–99)

## 2015-11-26 LAB — HEMOGLOBIN A1C: Hgb A1c MFr Bld: 9.5 % — ABNORMAL HIGH (ref 4.0–6.0)

## 2015-11-26 SURGERY — ESOPHAGOGASTRODUODENOSCOPY (EGD) WITH PROPOFOL
Anesthesia: General

## 2015-11-26 MED ORDER — FENTANYL CITRATE (PF) 100 MCG/2ML IJ SOLN
INTRAMUSCULAR | Status: DC | PRN
  Administered 2015-11-26: 50 ug via INTRAVENOUS

## 2015-11-26 MED ORDER — ONDANSETRON HCL 4 MG PO TABS: 4.0000 mg | ORAL_TABLET | Freq: Four times a day (QID) | ORAL | Status: DC | PRN

## 2015-11-26 MED ORDER — INSULIN ASPART 100 UNIT/ML ~~LOC~~ SOLN
0.0000 [IU] | Freq: Three times a day (TID) | SUBCUTANEOUS | Status: DC
  Administered 2015-11-26: 17:00:00 4 [IU] via SUBCUTANEOUS
  Administered 2015-11-26: 3 [IU] via SUBCUTANEOUS
  Administered 2015-11-26 – 2015-11-27 (×2): 4 [IU] via SUBCUTANEOUS
  Administered 2015-11-27: 3 [IU] via SUBCUTANEOUS
  Filled 2015-11-26: qty 4
  Filled 2015-11-26: qty 2
  Filled 2015-11-26: qty 3
  Filled 2015-11-26 (×2): qty 4

## 2015-11-26 MED ORDER — ONDANSETRON HCL 4 MG/2ML IJ SOLN
4.0000 mg | Freq: Four times a day (QID) | INTRAMUSCULAR | Status: DC | PRN
  Administered 2015-11-26 (×3): 4 mg via INTRAVENOUS
  Filled 2015-11-26 (×2): qty 2

## 2015-11-26 MED ORDER — MORPHINE SULFATE (PF) 2 MG/ML IV SOLN
1.0000 mg | INTRAVENOUS | Status: DC | PRN
  Administered 2015-11-26 – 2015-11-27 (×5): 1 mg via INTRAVENOUS
  Filled 2015-11-26 (×5): qty 1

## 2015-11-26 MED ORDER — SODIUM CHLORIDE 0.9 % IV SOLN
INTRAVENOUS | Status: DC
  Administered 2015-11-26: 01:00:00 via INTRAVENOUS
  Administered 2015-11-26: 1000 mL via INTRAVENOUS
  Administered 2015-11-26: 14:00:00 via INTRAVENOUS

## 2015-11-26 MED ORDER — PROPOFOL 500 MG/50ML IV EMUL
INTRAVENOUS | Status: DC | PRN
  Administered 2015-11-26: 100 ug/kg/min via INTRAVENOUS

## 2015-11-26 MED ORDER — SODIUM CHLORIDE 0.9 % IV SOLN: INTRAVENOUS | Status: DC

## 2015-11-26 MED ORDER — METOPROLOL TARTRATE 25 MG PO TABS
25.0000 mg | ORAL_TABLET | Freq: Two times a day (BID) | ORAL | Status: DC
  Administered 2015-11-26: 12:00:00 25 mg via ORAL
  Filled 2015-11-26: qty 1

## 2015-11-26 MED ORDER — IPRATROPIUM-ALBUTEROL 0.5-2.5 (3) MG/3ML IN SOLN
RESPIRATORY_TRACT | Status: AC
Start: 2015-11-26 — End: 2015-11-27
  Filled 2015-11-26: qty 3

## 2015-11-26 MED ORDER — SODIUM CHLORIDE 0.9 % IR SOLN: 1000.0000 mL | Freq: Once | Status: DC

## 2015-11-26 MED ORDER — OXYCODONE HCL 5 MG PO TABS
5.0000 mg | ORAL_TABLET | ORAL | Status: DC | PRN
  Administered 2015-11-26 – 2015-11-27 (×5): 5 mg via ORAL
  Filled 2015-11-26 (×5): qty 1

## 2015-11-26 MED ORDER — LIDOCAINE HCL (PF) 2 % IJ SOLN
INTRAMUSCULAR | Status: DC | PRN
  Administered 2015-11-26: 60 mg

## 2015-11-26 MED ORDER — NICOTINE 21 MG/24HR TD PT24
21.0000 mg | MEDICATED_PATCH | Freq: Every day | TRANSDERMAL | Status: DC
Start: 2015-11-26 — End: 2015-11-26

## 2015-11-26 MED ORDER — IPRATROPIUM-ALBUTEROL 0.5-2.5 (3) MG/3ML IN SOLN
3.0000 mL | Freq: Once | RESPIRATORY_TRACT | Status: AC
  Administered 2015-11-26: 3 mL via RESPIRATORY_TRACT

## 2015-11-26 MED ORDER — BISACODYL 5 MG PO TBEC: 5.0000 mg | DELAYED_RELEASE_TABLET | Freq: Every day | ORAL | Status: DC | PRN

## 2015-11-26 MED ORDER — METOPROLOL TARTRATE 25 MG PO TABS
25.0000 mg | ORAL_TABLET | Freq: Two times a day (BID) | ORAL | Status: DC
  Administered 2015-11-26 – 2015-11-27 (×2): 25 mg via ORAL
  Filled 2015-11-26 (×2): qty 1

## 2015-11-26 MED ORDER — PROPOFOL 10 MG/ML IV BOLUS
INTRAVENOUS | Status: DC | PRN
  Administered 2015-11-26: 50 mg via INTRAVENOUS

## 2015-11-26 MED ORDER — NICOTINE 21 MG/24HR TD PT24
21.0000 mg | MEDICATED_PATCH | Freq: Every day | TRANSDERMAL | Status: DC
  Administered 2015-11-26 – 2015-11-27 (×2): 21 mg via TRANSDERMAL
  Filled 2015-11-26 (×2): qty 1

## 2015-11-26 NOTE — Transfer of Care (Signed)
Immediate Anesthesia Transfer of Care Note  Patient: Adam Thomas  Procedure(s) Performed: Procedure(s): ESOPHAGOGASTRODUODENOSCOPY (EGD) WITH PROPOFOL (N/A)  Patient Location: PACU  Anesthesia Type:General  Level of Consciousness: sedated  Airway & Oxygen Therapy: Patient Spontanous Breathing and Patient connected to nasal cannula oxygen  Post-op Assessment: Report given to RN and Post -op Vital signs reviewed and stable  Post vital signs: Reviewed and stable  Last Vitals:  Filed Vitals:   11/26/15 1203 11/26/15 1337  BP: 182/88 171/94  Pulse: 88 77  Temp:  36.9 C  Resp:  18    Complications: No apparent anesthesia complications

## 2015-11-26 NOTE — Progress Notes (Signed)
[place] [facility] [place] NAME: Adam Thomas    MR#:  [id number]  DATE OF BIRTH:  1961-01-13  SUBJECTIVE:  CHIEF COMPLAINT:  Patient is reporting melena and stool mixed with blood. Lower abdominal discomfort and denies any hematemesis.  REVIEW OF SYSTEMS:  CONSTITUTIONAL: No fever, fatigue or weakness.  EYES: No blurred or double vision.  EARS, NOSE, AND THROAT: No tinnitus or ear pain.  RESPIRATORY: No cough, shortness of breath, wheezing or hemoptysis.  CARDIOVASCULAR: No chest pain, orthopnea, edema.  GASTROINTESTINAL: No nausea, vomiting, diarrhea or abdominal pain. Reporting melena GENITOURINARY: No dysuria, hematuria.  ENDOCRINE: No polyuria, nocturia,  HEMATOLOGY: No anemia, easy bruising or bleeding SKIN: No rash or lesion. MUSCULOSKELETAL: No joint pain or arthritis.   NEUROLOGIC: No tingling, numbness, weakness.  PSYCHIATRY: No anxiety or depression.   DRUG ALLERGIES:  No Known Allergies  VITALS:  Blood pressure 177/105, pulse 74, temperature 98.4 F (36.9 C), temperature source Tympanic, resp. rate 11, height 5\' 9"  (1.753 m), weight 94.348 kg (208 lb), SpO2 100 %.  PHYSICAL EXAMINATION:  GENERAL:  55 y.o.-year-old patient lying in the bed with no acute distress.  EYES: Pupils equal, round, reactive to light and accommodation. No scleral icterus. Extraocular muscles intact.  HEENT: Head atraumatic, normocephalic. Oropharynx and nasopharynx clear.  NECK:  Supple, no jugular venous distention. No thyroid enlargement, no tenderness.  LUNGS: Normal breath sounds bilaterally, no wheezing, rales,rhonchi or crepitation. No use of accessory muscles of respiration.  CARDIOVASCULAR: S1, S2 normal. No murmurs, rubs, or gallops.  ABDOMEN: Soft, ,  lower abdominal tenderness but no rebound tendernessended. Bowel sounds present. No organomegaly or mass.  EXTREMITIES: No pedal edema, cyanosis, or clubbing.  NEUROLOGIC: Cranial nerves  II through XII are intact. Muscle strength 5/5 in all extremities. Sensation intact. Gait not checked.  PSYCHIATRIC: The patient is alert and oriented x 3.  SKIN: No obvious rash, lesion, or ulcer.    LABORATORY PANEL:   CBC  Recent Labs Lab 11/26/15 0430  WBC 17.5*  HGB 11.9*  HCT 34.9*  PLT 269   ------------------------------------------------------------------------------------------------------------------  Chemistries   Recent Labs Lab 11/25/15 1954 11/26/15 0112  NA 134*  --   K 4.7  --   CL 100*  --   CO2 23  --   GLUCOSE 504*  --   BUN 41*  --   CREATININE 1.07 0.88  CALCIUM 9.0  --   AST 38  --   ALT 56  --   ALKPHOS 91  --   BILITOT 0.9  --    ------------------------------------------------------------------------------------------------------------------  Cardiac Enzymes No results for input(s): TROPONINI in the last 168 hours. ------------------------------------------------------------------------------------------------------------------  RADIOLOGY:  Dg Abd Acute W/chest  11/25/2015  CLINICAL DATA:  Acute onset of left lower quadrant abdominal pain and hematemesis. Dark tarry diarrhea. Piece of pork chop stuck in esophagus. Initial encounter. EXAM: DG ABDOMEN ACUTE W/ 1V CHEST COMPARISON:  Chest radiograph performed 11/28/2010 FINDINGS: The lungs are well-aerated. Scattered calcified granulomata within the lungs reflect remote granulomatous disease. Mild peribronchial thickening is noted. There is no evidence of focal opacification, pleural effusion or pneumothorax. The cardiomediastinal silhouette is within normal limits. The visualized bowel gas pattern is unremarkable. The colon is largely decompressed; there is no evidence of small bowel dilatation to suggest obstruction. No free intra-abdominal air is identified on the provided upright view. No acute osseous abnormalities are seen; the sacroiliac joints are unremarkable in appearance. No radiopaque  foreign bodies  are identified. A piece of pork chop is unlikely to be visible on radiograph. IMPRESSION: 1. Unremarkable bowel gas pattern; no free intra-abdominal air seen. Colon largely decompressed and grossly unremarkable. 2. No acute cardiopulmonary process seen. Mild peribronchial thickening noted. Electronically Signed   By: Garald Balding M.D.   On: 11/25/2015 22:30    EKG:   Orders placed or performed during the hospital encounter of 11/25/15  . EKG 12-Lead  . EKG 12-Lead    ASSESSMENT AND PLAN:    Adam Thomas is a 55 y.o. male with a known history of hepatitis C, hypertension, type 2 diabetes, tobacco abuse not taking any medications or following up with primary care physician comes to the emergency room after he had some pork chops last night. Patient started vomiting at 2 AM and had vomited blood at work second time. Came home went to bed and woke up and in the evening around 8:30 had another episode of dark maroon vomiting. He started having or should have a bowel movement and noticed black tarry stools.  1. GI bleed appears upper given history of hematemesis and melanotic stools -Could be NSAID related peptic ulcer disease versus esophageal versus secondary to possibly as hepatitis C related liver issues -EGD today --Monitor H&H closely. Transfuse as needed.  -Continue IV Protonix drip. -Follow up with GI  - Avoid NSAIDs aspirin or antiplatelet agents.  2. Uncontrolled type 2 diabetes secondary to medical noncompliance - on high-dose sliding scale insulin is nothing by mouth and supported off on oral meds. Patient may require insulin for his treatment. -Diabetes coordinator to follow up -Hemoglobin A1c-9.5  3. Hypertension again patient is not on any medications -He will need to be started on antihypertensives after EGD -on IV hydralazine when necessary for now since his nothing by mouth  4. Tobacco abuse smoking cessation advise for 5 minutes minutes spent   patient is considering to quit smoking     All the records are reviewed and case discussed with Care Management/Social Workerr. Management plans discussed with the patient, family and they are in agreement.  CODE STATUS: fc  TOTAL TIME TAKING CARE OF THIS PATIENT: 35  minutes.   POSSIBLE D/C IN 1-2 DAYS, DEPENDING ON CLINICAL CONDITION.   Nicholes Mango M.D on 11/26/2015 at 3:49 PM  Between 7am to 6pm - Pager - [phone] After 6pm go to [email or web address] - password [email or web address] [facility]  [facility]  Office  [phone]  CC: Primary care physician; No primary care provider on file.

## 2015-11-26 NOTE — Consult Note (Signed)
[facility]  [address] [place], [place] [address] Phone: [phone] Fax : [phone]  Consultation  Referring Provider:     No ref. provider found Primary Care Physician:  No primary care provider on file. Primary Gastroenterologist:           Reason for Consultation:     GI bleed  Date of Admission:  11/25/2015 Date of Consultation:  11/26/2015         HPI:   Adam SEELBACH is a 55 y.o. male who was admitted with melena for 3 days and vomiting up dark colored material. The patient reports that he was taking anti-inflammatory medications. The patient also states that he has been told that he has hepatitis C. The patient has never been treated for hepatitis C. The patient states that he one of the reasons he was not treated for his hepatitis C was because he had her stories about the injections that may use 6 and he did not want to be sick. The patient denies ever having bleeding in the past. He also denies any abdominal pain nausea vomiting fevers or chills prior to this. He states that he was found to have hepatitis C when he had a knee operation.  Past Medical History  Diagnosis Date  . Diabetes mellitus without complication ([place])   . Hypertension   . Hepatitis C   . High cholesterol     Past Surgical History  Procedure Laterality Date  . Left knee replacement Left     Prior to Admission medications   Not on File    History reviewed. No pertinent family history.   Social History  Substance Use Topics  . Smoking status: Current Every Day Smoker -- 1.00 packs/day for 35 years    Types: Cigarettes  . Smokeless tobacco: None  . Alcohol Use: No    Allergies as of 11/25/2015  . (No Known Allergies)    Review of Systems:    All systems reviewed and negative except where noted in HPI.   Physical Exam:  Vital signs in last 24 hours: Temp:  [98.2 F (36.8 C)-98.3 F (36.8 C)] 98.2 F (36.8 C) (03/03 0130) Pulse Rate:  [87-123] 88 (03/03  1203) Resp:  [15-23] 20 (03/03 0130) BP: (127-182)/(63-101) 182/88 mmHg (03/03 1203) SpO2:  [96 %-100 %] 100 % (03/03 1203) Weight:  [208 lb 12.8 oz (94.711 kg)] 208 lb 12.8 oz (94.711 kg) (03/03 0101) Last BM Date: 11/25/15 General:   Pleasant, cooperative in NAD Head:  Normocephalic and atraumatic. Eyes:   No icterus.   Conjunctiva pink. PERRLA. Ears:  Normal auditory acuity. Neck:  Supple; no masses or thyroidomegaly Lungs: Respirations even and unlabored. Lungs clear to auscultation bilaterally.   No wheezes, crackles, or rhonchi.  Heart:  Regular rate and rhythm;  Without murmur, clicks, rubs or gallops Abdomen:  Soft, nondistended, nontender. Normal bowel sounds. No appreciable masses or hepatomegaly.  No rebound or guarding.  Rectal:  Not performed. Msk:  Symmetrical without gross deformities.  Strength  Extremities:  Without edema, cyanosis or clubbing. Neurologic:  Alert and oriented x3;  grossly normal neurologically. Skin:  Intact without significant lesions or rashes. Cervical Nodes:  No significant cervical adenopathy. Psych:  Alert and cooperative. Normal affect.  LAB RESULTS:  Recent Labs  11/25/15 1954 11/26/15 0112 11/26/15 0430  WBC 17.1* 19.9* 17.5*  HGB 14.1 12.6* 11.9*  HCT 41.7 36.2* 34.9*  PLT 325 286 269   BMET  Recent Labs  11/25/15 1954 11/26/15 0112  NA 134*  --   K 4.7  --   CL 100*  --   CO2 23  --   GLUCOSE 504*  --   BUN 41*  --   CREATININE 1.07 0.88  CALCIUM 9.0  --    LFT  Recent Labs  11/25/15 1954  PROT 7.5  ALBUMIN 4.0  AST 38  ALT 56  ALKPHOS 91  BILITOT 0.9   PT/INR  Recent Labs  11/25/15 1954  LABPROT 14.6  INR 1.12    STUDIES: Dg Abd Acute W/chest  11/25/2015  CLINICAL DATA:  Acute onset of left lower quadrant abdominal pain and hematemesis. Dark tarry diarrhea. Piece of pork chop stuck in esophagus. Initial encounter. EXAM: DG ABDOMEN ACUTE W/ 1V CHEST COMPARISON:  Chest radiograph performed 11/28/2010  FINDINGS: The lungs are well-aerated. Scattered calcified granulomata within the lungs reflect remote granulomatous disease. Mild peribronchial thickening is noted. There is no evidence of focal opacification, pleural effusion or pneumothorax. The cardiomediastinal silhouette is within normal limits. The visualized bowel gas pattern is unremarkable. The colon is largely decompressed; there is no evidence of small bowel dilatation to suggest obstruction. No free intra-abdominal air is identified on the provided upright view. No acute osseous abnormalities are seen; the sacroiliac joints are unremarkable in appearance. No radiopaque foreign bodies are identified. A piece of pork chop is unlikely to be visible on radiograph. IMPRESSION: 1. Unremarkable bowel gas pattern; no free intra-abdominal air seen. Colon largely decompressed and grossly unremarkable. 2. No acute cardiopulmonary process seen. Mild peribronchial thickening noted. Electronically Signed   By: Garald Balding M.D.   On: 11/25/2015 22:30      Impression / Plan:   Adam Thomas is a 55 y.o. y/o male with who comes in with a stable hemoglobin and black stools. The patient also vomited up some coffee ground material. The patient will be set up for an EGD for today. The patient has been nothing by mouth since last night. The patient's differential diagnosis includes a peptic ulcer due to his NSAID use versus him having esophageal varices due to his hepatitis C and possible cirrhosis. The patient does not have any other signs of cirrhosis so this in my opinion is less likely. The patient has been set up for the procedure today. I have discussed risks & benefits which include, but are not limited to, bleeding, infection, perforation & drug reaction.  The patient agrees with this plan & written consent will be obtained.      Thank you for involving me in the care of this patient.      LOS: 1 day   Ollen Bowl, MD  11/26/2015, 12:04 PM   Note:  This dictation was prepared with Dragon dictation along with smaller phrase technology. Any transcriptional errors that result from this process are unintentional.

## 2015-11-26 NOTE — Progress Notes (Signed)
Inpatient Diabetes Program Recommendations  AACE/ADA: New Consensus Statement on Inpatient Glycemic Control (2015)  Target Ranges:  Prepandial:   less than 140 mg/dL      Peak postprandial:   less than 180 mg/dL (1-2 hours)      Critically ill patients:  140 - 180 mg/dL   Review of Glycemic Control  Results for [name], [name] (MRN [id number]) as of 11/26/2015 13:56  Ref. Range 11/25/2015 23:49 11/26/2015 08:36 11/26/2015 12:05  Glucose-Capillary Latest Ref Range: 65-99 mg/dL 274 (H) 163 (H) 149 (H)    Diabetes history: Type 2, A1C ordered Outpatient Diabetes medications: unknown  Current orders for Inpatient glycemic control: Novolog 0-20 units tid  Inpatient Diabetes Program Recommendations:  Since patient is NPO, please consider changing Novolog correction to 0-20 units q4h  Gentry Fitz, RN, [place], [place], CDE Diabetes Coordinator Inpatient Diabetes Program  [phone] (Team Pager) [phone] ([place]) 11/26/2015 2:02 PM

## 2015-11-26 NOTE — Anesthesia Postprocedure Evaluation (Signed)
Anesthesia Post Note  Patient: Adam Thomas  Procedure(s) Performed: Procedure(s) (LRB): ESOPHAGOGASTRODUODENOSCOPY (EGD) WITH PROPOFOL (N/A)  Patient location during evaluation: Endoscopy Anesthesia Type: General Level of consciousness: awake and alert Pain management: pain level controlled Vital Signs Assessment: post-procedure vital signs reviewed and stable Respiratory status: spontaneous breathing, nonlabored ventilation, respiratory function stable and patient connected to nasal cannula oxygen Cardiovascular status: blood pressure returned to baseline and stable Postop Assessment: no signs of nausea or vomiting Anesthetic complications: no    Last Vitals:  Filed Vitals:   11/26/15 1440 11/26/15 1630  BP: 177/105 166/96  Pulse: 74 83  Temp:  36.6 C  Resp: 11 16    Last Pain:  Filed Vitals:   11/26/15 1653  PainSc: 7                  Broadus John K Meir [place]

## 2015-11-26 NOTE — Evaluation (Signed)
Physical Therapy Evaluation Patient Details Name: KANIEL BOTLEY MRN: [id number] DOB: [birth year] Today's Date: 11/26/2015   History of Present Illness  Pt is a 55 y.o. male with PMH of hepatitis C, HTN and diabetes.  Pt presented with vomiting blood, blood in stool and weakness.  Pt was admitted for GI bleed.    Clinical Impression  Prior to admission pt was independent and s/p L TKR in 2013.  Pt reported some decreased AROM in L knee; but no other difficulties with L knee.  Pt lives with cousin and his or her family. Pt's initial blood pressure was 178/89, HR and O2 saturation were WNL.  Pt then ambulated to chair with no AD and supervision.  Pt's blood pressure was then 172/89.  Pt then ambulated 20 feet with no AD and supervision.  Pt's blood pressure was then 168/90.  HR and O2 saturation were WNL the entire session.  Session was then terminated due to high blood pressure.  Nursing was notified about high blood pressure.  Due to aforementioned function and strength deficits, pt is in need of skilled physical therapy.  It is recommended that pt is discharged to home with familial support when medically appropriate.     Follow Up Recommendations No PT follow up    Equipment Recommendations       Recommendations for Other Services       Precautions / Restrictions Precautions Precautions: Fall Restrictions Weight Bearing Restrictions: No      Mobility  Bed Mobility Overal bed mobility: Independent                Transfers Overall transfer level: Needs assistance Equipment used: None Transfers: Sit to/from Stand Sit to Stand: Supervision            Ambulation/Gait Ambulation/Gait assistance: Supervision Ambulation Distance (Feet): 20 Feet Assistive device: None Gait Pattern/deviations: WFL(Within Functional Limits) Gait velocity: decreased      Stairs            Wheelchair Mobility    Modified Rankin (Stroke Patients Only)       Balance  Overall balance assessment: No apparent balance deficits (not formally assessed)                                           Pertinent Vitals/Pain Pain Assessment: 0-10 Pain Score: 6  Pain Location: Anterior abdomen Pain Descriptors / Indicators: Aching;Constant Pain Intervention(s): Limited activity within patient's tolerance;Monitored during session;Premedicated before session;Repositioned  See flow sheet for vitals.     Home Living Family/patient expects to be discharged to:: Private residence Living Arrangements: Other relatives Available Help at Discharge: Family Type of Home: House Home Access: Stairs to enter Entrance Stairs-Rails: Right Entrance Stairs-Number of Steps: [place]: Two level (Pt lives on first floor) Home Equipment: Environmental consultant - 2 wheels;Cane - single point;Crutches      Prior Function Level of Independence: Independent               Hand Dominance        Extremity/Trunk Assessment   Upper Extremity Assessment: Overall WFL for tasks assessed           Lower Extremity Assessment: Overall WFL for tasks assessed  Bilateral quadriceps, hamstrings, dorsiflexors were all 5/5.  Bilateral Hip flexors, hip abductors, hip adductors and plantarflexors were at least a 3/5.      Cervical /  Trunk Assessment: Normal  Communication   Communication: No difficulties  Cognition Arousal/Alertness: Awake/alert Behavior During Therapy: WFL for tasks assessed/performed Overall Cognitive Status: Within Functional Limits for tasks assessed                      General Comments  Nursing was contacted and cleared pt for physical therapy.  Pt was agreeable and session was modified due to high blood pressure.    Exercises        Assessment/Plan    PT Assessment Patient needs continued PT services  PT Diagnosis Other (comment) (diificulty with activity tolerance due to blood pressure)   PT Problem List Decreased activity  tolerance;Pain;Decreased mobility  PT Treatment Interventions Functional mobility training;Therapeutic activities;Therapeutic exercise;Patient/family education;Stair training   PT Goals (Current goals can be found in the Care Plan section) Acute Rehab PT Goals Patient Stated Goal: to go home PT Goal Formulation: With patient Time For Goal Achievement: 12/10/15 Potential to Achieve Goals: Good    Frequency Min 2X/week   Barriers to discharge        Co-evaluation               End of Session Equipment Utilized During Treatment: Gait belt Activity Tolerance: Other (comment) (Pt limited by blood pressure) Patient left: in chair;with chair alarm set;with family/visitor present;with call bell/phone within reach           Time: 1116-1140 PT Time Calculation (min) (ACUTE ONLY): 24 min   Charges:         PT G Codes:       Mittie Bodo, SPT Mittie Bodo 11/26/2015, 12:30 PM

## 2015-11-26 NOTE — OR Nursing (Signed)
15:45 - Patient transferred to Room 128 via hospital bed.  Report given Siri Cole, RN.  Norlene Campbell, RN

## 2015-11-26 NOTE — Op Note (Signed)
[facility] Patient Name: Adam Thomas Procedure Date: 11/26/2015 1:56 PM MRN: [id number] Account #: [id number] Date of Birth: [birth year] Admit Type: Inpatient Age: 55 Room: [facility] ENDO ROOM 4 Gender: Male Note Status: Finalized Procedure:            Upper GI endoscopy Indications:          Hematemesis Providers:            Lucilla Lame, MD Medicines:            Propofol per Anesthesia Complications:        No immediate complications. Procedure:            Pre-Anesthesia Assessment:                       - Prior to the procedure, a History and Physical was                        performed, and patient medications and allergies were                        reviewed. The patient's tolerance of previous                        anesthesia was also reviewed. The risks and benefits of                        the procedure and the sedation options and risks were                        discussed with the patient. All questions were                        answered, and informed consent was obtained. Prior                        Anticoagulants: The patient has taken no previous                        anticoagulant or antiplatelet agents. ASA Grade                        Assessment: II - A patient with mild systemic disease.                        After reviewing the risks and benefits, the patient was                        deemed in satisfactory condition to undergo the                        procedure.                       After obtaining informed consent, the endoscope was                        passed under direct vision. Throughout the procedure,                        the patient's blood pressure, pulse, and oxygen  saturations were monitored continuously. The Olympus                        GIF-160 endoscope (S#. [id number]) was introduced through                        the mouth, and advanced to the second part of duodenum.               The upper GI endoscopy was accomplished without                        difficulty. The patient tolerated the procedure well. Findings:      A non-bleeding Mallory-Weiss tear with no stigmata of recent bleeding       was found.      Localized mild inflammation characterized by erythema was found in the       gastric antrum.      Localized mild inflammation was found in the duodenal bulb. Impression:           - Mallory-Weiss tear.                       - Gastritis.                       - Duodenitis.                       - No specimens collected. Recommendation:       - Use a proton pump inhibitor PO daily. Procedure Code(s):    --- Professional ---                       [phone], Esophagogastroduodenoscopy, flexible, transoral;                        diagnostic, including collection of specimen(s) by                        brushing or washing, when performed (separate procedure) Diagnosis Code(s):    --- Professional ---                       K92.0, Hematemesis                       K22.6, Gastro-esophageal laceration-hemorrhage syndrome                       K29.70, Gastritis, unspecified, without bleeding CPT copyright 2016 American Medical Association. All rights reserved. The codes documented in this report are preliminary and upon coder review may  be revised to meet current compliance requirements. Lucilla Lame, MD 11/26/2015 2:08:16 PM This report has been signed electronically. Number of Addenda: 0 Note Initiated On: 11/26/2015 1:56 PM      [facility]

## 2015-11-26 NOTE — Anesthesia Preprocedure Evaluation (Signed)
Anesthesia Evaluation  Patient identified by MRN, date of birth, ID band Patient awake    Reviewed: Allergy & Precautions, H&P , NPO status , Patient's Chart, lab work & pertinent test results  History of Anesthesia Complications Negative for: history of anesthetic complications  Airway Mallampati: III  TM Distance: >3 FB Neck ROM: full    Dental  (+) Poor Dentition, Chipped   Pulmonary neg shortness of breath, Current Smoker,    Pulmonary exam normal breath sounds clear to auscultation       Cardiovascular Exercise Tolerance: Good hypertension, (-) angina(-) Past MI and (-) DOE Normal cardiovascular exam Rhythm:regular Rate:Normal     Neuro/Psych negative neurological ROS  negative psych ROS   GI/Hepatic negative GI ROS, (+) Hepatitis -, C  Endo/Other  diabetes, Poorly Controlled, Type 2  Renal/GU negative Renal ROS  negative genitourinary   Musculoskeletal   Abdominal   Peds  Hematology negative hematology ROS (+)   Anesthesia Other Findings Past Medical History:   Diabetes mellitus without complication (HCC)                 Hypertension                                                 Hepatitis C                                                  High cholesterol                                            Past Surgical History:   left knee replacement                           Left             BMI    Body Mass Index   30.70 kg/m 2    Patient is NPO appropriate and reports no nausea or vomiting today.   Reproductive/Obstetrics negative OB ROS                             Anesthesia Physical Anesthesia Plan  ASA: III  Anesthesia Plan: General   Post-op Pain Management:    Induction:   Airway Management Planned:   Additional Equipment:   Intra-op Plan:   Post-operative Plan:   Informed Consent: I have reviewed the patients History and Physical, chart, labs and  discussed the procedure including the risks, benefits and alternatives for the proposed anesthesia with the patient or authorized representative who has indicated his/her understanding and acceptance.   Dental Advisory Given  Plan Discussed with: Anesthesiologist, CRNA and Surgeon  Anesthesia Plan Comments:         Anesthesia Quick Evaluation

## 2015-11-26 NOTE — Progress Notes (Signed)
Per PT, pt's SBP was elevated at 178 when they worked with him today. Upon standing, his SBP dropped to 168. He remained asymptomatic. MD made aware, will start metoprolol 25mg  PO BID per MD order.

## 2015-11-26 NOTE — ED Notes (Signed)
Attempted to call report. Nurse not available. Will try again later.

## 2015-11-27 LAB — BASIC METABOLIC PANEL
ANION GAP: 6 (ref 5–15)
BUN: 15 mg/dL (ref 6–20)
CO2: 24 mmol/L (ref 22–32)
Calcium: 8 mg/dL — ABNORMAL LOW (ref 8.9–10.3)
Chloride: 104 mmol/L (ref 101–111)
Creatinine, Ser: 0.8 mg/dL (ref 0.61–1.24)
GFR calc Af Amer: >60 mL/min (ref >60)
GLUCOSE: 147 mg/dL — AB (ref 65–99)
POTASSIUM: 3.8 mmol/L (ref 3.5–5.1)
Sodium: 134 mmol/L — ABNORMAL LOW (ref 135–145)

## 2015-11-27 LAB — CBC
HEMATOCRIT: 29.5 % — AB (ref 40.0–52.0)
HEMOGLOBIN: 10.3 g/dL — AB (ref 13.0–18.0)
MCH: 30 pg (ref 26.0–34.0)
MCHC: 35 g/dL (ref 32.0–36.0)
MCV: 85.9 fL (ref 80.0–100.0)
Platelets: 211 10*3/uL (ref 150–440)
RBC: 3.44 MIL/uL — AB (ref 4.40–5.90)
RDW: 12.9 % (ref 11.5–14.5)
WBC: 9.2 10*3/uL (ref 3.8–10.6)

## 2015-11-27 LAB — GLUCOSE, CAPILLARY
GLUCOSE-CAPILLARY: 171 mg/dL — AB (ref 65–99)
Glucose-Capillary: 131 mg/dL — ABNORMAL HIGH (ref 65–99)

## 2015-11-27 MED ORDER — GLIPIZIDE 5 MG PO TABS: 5.0000 mg | ORAL_TABLET | Freq: Two times a day (BID) | ORAL | Status: AC

## 2015-11-27 MED ORDER — OMEPRAZOLE 40 MG PO CPDR: 40.0000 mg | DELAYED_RELEASE_CAPSULE | Freq: Every day | ORAL | Status: AC

## 2015-11-27 MED ORDER — GLIPIZIDE 10 MG PO TABS: 5.0000 mg | ORAL_TABLET | Freq: Two times a day (BID) | ORAL | Status: DC

## 2015-11-27 MED ORDER — HYDRALAZINE HCL 25 MG PO TABS: 25.0000 mg | ORAL_TABLET | Freq: Three times a day (TID) | ORAL | Status: AC

## 2015-11-27 MED ORDER — HYDRALAZINE HCL 25 MG PO TABS: 25.0000 mg | ORAL_TABLET | Freq: Three times a day (TID) | ORAL | Status: DC

## 2015-11-27 MED ORDER — METOPROLOL TARTRATE 25 MG PO TABS: 25.0000 mg | ORAL_TABLET | Freq: Two times a day (BID) | ORAL | Status: AC

## 2015-11-27 NOTE — Progress Notes (Signed)
Pt alert and oriented x4, no complaints of pain or discomfort.  Bed in low position, call bell within reach.  Bed alarms on and functioning.  Assessment done and charted.  Will continue to monitor and do hourly rounding throughout the shift 

## 2015-11-27 NOTE — Discharge Summary (Signed)
[facility] [place] NAME: Adam Thomas    MR#:  [id number]  DATE OF BIRTH:  [birth year]  DATE OF ADMISSION:  11/25/2015 ADMITTING PHYSICIAN: Fritzi Mandes, MD  DATE OF DISCHARGE: 11/27/15  PRIMARY CARE PHYSICIAN: No primary care provider on file.    ADMISSION DIAGNOSIS:  Melena [K92.1] Hyperglycemia [R73.9] Hematemesis with nausea [K92.0, R11.0]  DISCHARGE DIAGNOSIS:  GI bleed  Due to MW tear/gastritis?duodenitis Type-2 DM HTN Tobacco abuse  SECONDARY DIAGNOSIS:   Past Medical History  Diagnosis Date  . Diabetes mellitus without complication ([place])   . Hypertension   . Hepatitis C   . High cholesterol     HOSPITAL COURSE:   Adam Thomas is a 55 y.o. male with a known history of hepatitis C, hypertension, type 2 diabetes, tobacco abuse not taking any medications or following up with primary care physician comes to the emergency room after he had some pork chops last night. Patient started vomiting at 2 AM and had vomited blood at work second time. Came home went to bed and woke up and in the evening around 8:30 had another episode of dark maroon vomiting. He started having or should have a bowel movement and noticed black tarry stools.  1. GI bleed appears upper given history of hematemesis and melanotic stools -Could be NSAID related peptic ulcer disease versus esophageal versus secondary to possibly as hepatitis C related liver issues -EGD  showedMallory-Weiss tear.  - Gastritis.  - Duodenitis  --Monitor H&H closely. Transfuse as needed.  -recieved IV Protonix drip.---change to po prilosec - Avoid NSAIDs aspirin or antiplatelet agents.  2. Uncontrolled type 2 diabetes secondary to medical noncompliance - on high-dose sliding scale insulin is nothing by mouth and supported off on oral meds. Patient may require insulin for his treatment. -Diabetes coordinator to follow  up -Hemoglobin A1c-9.5 -start po glipizide  3. Hypertension again patient is not on any medications -po metoprolol and hydralazine  4. Tobacco abuse smoking cessation advise for 5 minutes minutes spent  D/c home today. [place] with GI Spoke with pt and dter d/c plans CONSULTS OBTAINED:  Treatment Team:  Lucilla Lame, MD  DRUG ALLERGIES:  No Known Allergies  DISCHARGE MEDICATIONS:   Current Discharge Medication List    START taking these medications   Details  glipiZIDE (GLUCOTROL) 5 MG tablet Take 1 tablet (5 mg total) by mouth 2 (two) times daily before a meal. Qty: 120 tablet, Refills: 2    hydrALAZINE (APRESOLINE) 25 MG tablet Take 1 tablet (25 mg total) by mouth every 8 (eight) hours. Qty: 90 tablet, Refills: 2    metoprolol tartrate (LOPRESSOR) 25 MG tablet Take 1 tablet (25 mg total) by mouth 2 (two) times daily. Qty: 60 tablet, Refills: 2    omeprazole (PRILOSEC) 40 MG capsule Take 1 capsule (40 mg total) by mouth daily. Qty: 30 capsule, Refills: 2        If you experience worsening of your admission symptoms, develop shortness of breath, life threatening emergency, suicidal or homicidal thoughts you must seek medical attention immediately by calling 911 or calling your MD immediately  if symptoms less severe.  You Must read complete instructions/literature along with all the possible adverse reactions/side effects for all the Medicines you take and that have been prescribed to you. Take any new Medicines after you have completely understood and accept all the possible adverse reactions/side effects.   Please note  You were cared for by a hospitalist  during your hospital stay. If you have any questions about your discharge medications or the care you received while you were in the hospital after you are discharged, you can call the unit and asked to speak with the hospitalist on call if the hospitalist that took care of you is not available. Once you are discharged, your  primary care physician will handle any further medical issues. Please note that NO REFILLS for any discharge medications will be authorized once you are discharged, as it is imperative that you return to your primary care physician (or establish a relationship with a primary care physician if you do not have one) for your aftercare needs so that they can reassess your need for medications and monitor your lab values. Today   SUBJECTIVE   No complaints  VITAL SIGNS:  Blood pressure 177/89, pulse 80, temperature 98 F (36.7 C), temperature source Oral, resp. rate 20, height 5\' 9"  (1.753 m), weight 94.348 kg (208 lb), SpO2 99 %.  I/O:   Intake/Output Summary (Last 24 hours) at 11/27/15 1146 Last data filed at 11/27/15 0800  Gross per 24 hour  Intake    560 ml  Output   1750 ml  Net  -1190 ml    PHYSICAL EXAMINATION:  GENERAL:  55 y.o.-year-old patient lying in the bed with no acute distress.  EYES: Pupils equal, round, reactive to light and accommodation. No scleral icterus. Extraocular muscles intact.  HEENT: Head atraumatic, normocephalic. Oropharynx and nasopharynx clear.  NECK:  Supple, no jugular venous distention. No thyroid enlargement, no tenderness.  LUNGS: Normal breath sounds bilaterally, no wheezing, rales,rhonchi or crepitation. No use of accessory muscles of respiration.  CARDIOVASCULAR: S1, S2 normal. No murmurs, rubs, or gallops.  ABDOMEN: Soft, non-tender, non-distended. Bowel sounds present. No organomegaly or mass.  EXTREMITIES: No pedal edema, cyanosis, or clubbing.  NEUROLOGIC: Cranial nerves II through XII are intact. Muscle strength 5/5 in all extremities. Sensation intact. Gait not checked.  PSYCHIATRIC: The patient is alert and oriented x 3.  SKIN: No obvious rash, lesion, or ulcer.   DATA REVIEW:   CBC   Recent Labs Lab 11/27/15 0634  WBC 9.2  HGB 10.3*  HCT 29.5*  PLT 211    Chemistries   Recent Labs Lab 11/25/15 1954  11/27/15 0634  NA  134*  --  134*  K 4.7  --  3.8  CL 100*  --  104  CO2 23  --  24  GLUCOSE 504*  --  147*  BUN 41*  --  15  CREATININE 1.07  < > 0.80  CALCIUM 9.0  --  8.0*  AST 38  --   --   ALT 56  --   --   ALKPHOS 91  --   --   BILITOT 0.9  --   --   < > = values in this interval not displayed.  Microbiology Results   No results found for this or any previous visit (from the past 240 hour(s)).  RADIOLOGY:  Dg Abd Acute W/chest  11/25/2015  CLINICAL DATA:  Acute onset of left lower quadrant abdominal pain and hematemesis. Dark tarry diarrhea. Piece of pork chop stuck in esophagus. Initial encounter. EXAM: DG ABDOMEN ACUTE W/ 1V CHEST COMPARISON:  Chest radiograph performed 11/28/2010 FINDINGS: The lungs are well-aerated. Scattered calcified granulomata within the lungs reflect remote granulomatous disease. Mild peribronchial thickening is noted. There is no evidence of focal opacification, pleural effusion or pneumothorax. The cardiomediastinal silhouette is within  normal limits. The visualized bowel gas pattern is unremarkable. The colon is largely decompressed; there is no evidence of small bowel dilatation to suggest obstruction. No free intra-abdominal air is identified on the provided upright view. No acute osseous abnormalities are seen; the sacroiliac joints are unremarkable in appearance. No radiopaque foreign bodies are identified. A piece of pork chop is unlikely to be visible on radiograph. IMPRESSION: 1. Unremarkable bowel gas pattern; no free intra-abdominal air seen. Colon largely decompressed and grossly unremarkable. 2. No acute cardiopulmonary process seen. Mild peribronchial thickening noted. Electronically Signed   By: Garald Balding M.D.   On: 11/25/2015 22:30     Management plans discussed with the patient, family and they are in agreement.  CODE STATUS:     Code Status Orders        Start     Ordered   11/26/15 0045  Full code   Continuous     11/26/15 0044    Code Status  History    Date Active Date Inactive Code Status Order ID Comments User Context   This patient has a current code status but no historical code status.      TOTAL TIME TAKING CARE OF THIS PATIENT: 40 minutes.    Ashtan Laton M.D on 11/27/2015 at 11:46 AM  Between 7am to 6pm - Pager - [phone] After 6pm go to [email or web address] - password [email or web address] [facility]  [facility]  Office  [phone]  CC: Primary care physician; No primary care provider on file.

## 2015-11-27 NOTE — Progress Notes (Signed)
Discharge: Pt d/c from room via wheelchair, Family member with the pt. Discharge instructions given to the patient and family members.  No questions from pt, reintegrated to the pt to call or go to the ED for chest discomfort. Pt dressed in street clothes and left with discharge papers and prescriptions in hand. IV d/ced, and no complaints of severe pain or discomfort.

## 2015-11-27 NOTE — Discharge Instructions (Signed)
Keep log of your sugars

## 2015-11-29 ENCOUNTER — Encounter: Payer: Self-pay | Admitting: Gastroenterology

## 2020-04-15 ENCOUNTER — Other Ambulatory Visit: Payer: Self-pay | Admitting: Family Medicine

## 2020-04-15 ENCOUNTER — Other Ambulatory Visit (HOSPITAL_COMMUNITY): Payer: Self-pay | Admitting: Family Medicine

## 2020-04-15 DIAGNOSIS — R079 Chest pain, unspecified: Secondary | ICD-10-CM

## 2020-04-15 DIAGNOSIS — R221 Localized swelling, mass and lump, neck: Secondary | ICD-10-CM

## 2020-04-26 ENCOUNTER — Other Ambulatory Visit: Payer: Self-pay

## 2020-04-26 ENCOUNTER — Ambulatory Visit
Admission: RE | Admit: 2020-04-26 | Discharge: 2020-04-26 | Disposition: A | Discharge status: Home / Self Care | Payer: BC Managed Care – PPO | Source: Ambulatory Visit | Attending: Family Medicine | Admitting: Family Medicine

## 2020-04-26 DIAGNOSIS — R221 Localized swelling, mass and lump, neck: Secondary | ICD-10-CM | POA: Insufficient documentation

## 2020-04-26 DIAGNOSIS — R079 Chest pain, unspecified: Secondary | ICD-10-CM | POA: Diagnosis present

## 2020-04-29 ENCOUNTER — Telehealth: Payer: Self-pay | Admitting: *Deleted

## 2020-04-29 DIAGNOSIS — Z122 Encounter for screening for malignant neoplasm of respiratory organs: Secondary | ICD-10-CM

## 2020-04-29 DIAGNOSIS — Z87891 Personal history of nicotine dependence: Secondary | ICD-10-CM

## 2020-04-29 NOTE — Telephone Encounter (Signed)
Received referral for initial lung cancer screening scan. Contacted patient and obtained smoking history,(former, quit 09/2018, 35 pack year) as well as answering questions related to screening process. Patient denies signs of lung cancer such as weight loss or hemoptysis. Patient denies comorbidity that would prevent curative treatment if lung cancer were found. Patient is scheduled for shared decision making visit and CT scan on 05/12/20 at 1030am.

## 2020-04-30 ENCOUNTER — Other Ambulatory Visit: Payer: Self-pay | Admitting: Student

## 2020-04-30 DIAGNOSIS — I208 Other forms of angina pectoris: Secondary | ICD-10-CM

## 2020-04-30 DIAGNOSIS — R0602 Shortness of breath: Secondary | ICD-10-CM

## 2020-05-07 ENCOUNTER — Other Ambulatory Visit: Payer: BC Managed Care – PPO

## 2020-05-12 ENCOUNTER — Ambulatory Visit
Admission: RE | Admit: 2020-05-12 | Discharge: 2020-05-12 | Disposition: A | Discharge status: Home / Self Care | Payer: BC Managed Care – PPO | Source: Ambulatory Visit | Attending: Oncology | Admitting: Oncology

## 2020-05-12 ENCOUNTER — Other Ambulatory Visit: Payer: Self-pay

## 2020-05-12 ENCOUNTER — Inpatient Hospital Stay: Payer: BC Managed Care – PPO | Attending: Oncology | Admitting: Hospice and Palliative Medicine

## 2020-05-12 ENCOUNTER — Encounter: Payer: Self-pay | Admitting: Hospice and Palliative Medicine

## 2020-05-12 DIAGNOSIS — Z87891 Personal history of nicotine dependence: Secondary | ICD-10-CM | POA: Diagnosis not present

## 2020-05-12 DIAGNOSIS — Z122 Encounter for screening for malignant neoplasm of respiratory organs: Secondary | ICD-10-CM | POA: Diagnosis present

## 2020-05-12 NOTE — Progress Notes (Signed)
Virtual Visit via Video Note  I connected with@ on 05/12/20 at@ by a video enabled telemedicine application and verified that I am speaking with the correct person using two identifiers.   I discussed the limitations of evaluation and management by telemedicine and the availability of in person appointments. The patient expressed understanding and agreed to proceed.  In accordance with CMS guidelines, patient has met eligibility criteria including age, absence of signs or symptoms of lung cancer.  Social History   Tobacco Use  . Smoking status: Former Smoker    Packs/day: 1.00    Years: 35.00    Pack years: 35.00    Types: Cigarettes    Quit date: 09/25/2018    Years since quitting: 1.6  Substance Use Topics  . Alcohol use: No  . Drug use: Not on file      A shared decision-making session was conducted prior to the performance of CT scan. This includes one or more decision aids, includes benefits and harms of screening, follow-up diagnostic testing, over-diagnosis, false positive rate, and total radiation exposure.   Counseling on the importance of adherence to annual lung cancer LDCT screening, impact of co-morbidities, and ability or willingness to undergo diagnosis and treatment is imperative for compliance of the program.   Counseling on the importance of continued smoking cessation for former smokers; the importance of smoking cessation for current smokers, and information about tobacco cessation interventions have been given to patient including [place] and [employer] quit [place] programs.   Written order for lung cancer screening with LDCT has been given to the patient and any and all questions have been answered to the best of my abilities.    Yearly follow up will be coordinated by Burgess Estelle, Thoracic Navigator.  Time Total: 15 minutes  Visit consisted of counseling and education dealing with complex health screening. Greater than 50%  of this time was spent  counseling and coordinating care related to the above assessment and plan.  Signed by: Altha Harm, PhD, NP-C

## 2020-05-17 ENCOUNTER — Encounter: Payer: Self-pay | Admitting: *Deleted

## 2020-06-07 ENCOUNTER — Other Ambulatory Visit: Payer: Self-pay | Admitting: Otolaryngology

## 2020-06-07 DIAGNOSIS — R221 Localized swelling, mass and lump, neck: Secondary | ICD-10-CM

## 2020-06-14 ENCOUNTER — Other Ambulatory Visit: Payer: Self-pay | Admitting: Otolaryngology

## 2020-06-14 DIAGNOSIS — R221 Localized swelling, mass and lump, neck: Secondary | ICD-10-CM

## 2020-06-14 DIAGNOSIS — K118 Other diseases of salivary glands: Secondary | ICD-10-CM

## 2020-06-18 ENCOUNTER — Ambulatory Visit
Admission: RE | Admit: 2020-06-18 | Discharge: 2020-06-18 | Disposition: A | Discharge status: Home / Self Care | Payer: BC Managed Care – PPO | Source: Ambulatory Visit | Attending: Otolaryngology | Admitting: Otolaryngology

## 2020-06-18 ENCOUNTER — Other Ambulatory Visit: Payer: Self-pay

## 2020-06-18 DIAGNOSIS — R221 Localized swelling, mass and lump, neck: Secondary | ICD-10-CM | POA: Diagnosis present

## 2020-06-18 LAB — POCT I-STAT CREATININE: Creatinine, Ser: 0.8 mg/dL (ref 0.61–1.24)

## 2020-06-18 MED ORDER — IOHEXOL 300 MG/ML  SOLN
75.0000 mL | Freq: Once | INTRAMUSCULAR | Status: AC | PRN
  Administered 2020-06-18: 75 mL via INTRAVENOUS

## 2020-06-22 ENCOUNTER — Ambulatory Visit: Payer: BC Managed Care – PPO

## 2020-06-23 ENCOUNTER — Ambulatory Visit: Payer: BC Managed Care – PPO

## 2020-06-25 ENCOUNTER — Other Ambulatory Visit: Payer: Self-pay | Admitting: Student

## 2020-06-25 NOTE — Progress Notes (Signed)
Patient on schedule for biopsy 06/28/2020, spoke with patient on phone, made aware to be here @ 0930, NPO after MN prior to procedure and have driver for discharge post procedure in case patient decides to be sedated. Stated understanding.

## 2020-06-28 ENCOUNTER — Ambulatory Visit
Admission: RE | Admit: 2020-06-28 | Discharge: 2020-06-28 | Disposition: A | Discharge status: Home / Self Care | Payer: BC Managed Care – PPO | Source: Ambulatory Visit | Attending: Otolaryngology | Admitting: Otolaryngology

## 2020-06-28 ENCOUNTER — Other Ambulatory Visit: Payer: Self-pay

## 2020-06-28 DIAGNOSIS — K118 Other diseases of salivary glands: Secondary | ICD-10-CM | POA: Diagnosis present

## 2020-06-28 DIAGNOSIS — R221 Localized swelling, mass and lump, neck: Secondary | ICD-10-CM

## 2020-06-28 DIAGNOSIS — D110 Benign neoplasm of parotid gland: Secondary | ICD-10-CM | POA: Diagnosis not present

## 2020-06-28 MED ORDER — SODIUM CHLORIDE 0.9 % IV SOLN: INTRAVENOUS | Status: DC

## 2020-06-28 NOTE — Procedures (Signed)
Interventional Radiology Procedure Note  Procedure: US guided parotid mass biopsy, LEFT  Complications: None  Estimated Blood Loss: None  Recommendations: - DC home   Signed,  Criselda Peaches, MD

## 2020-06-28 NOTE — Discharge Instructions (Signed)
Needle Biopsy, Care After This sheet gives you information about how to care for yourself after your procedure. Your health care provider may also give you more specific instructions. If you have problems or questions, contact your health care provider. What can I expect after the procedure? After the procedure, it is common to have soreness, bruising, or mild pain at the puncture site. This should go away in a few days. Follow these instructions at home: Needle insertion site care   Wash your hands with soap and water before you change your bandage (dressing). If you cannot use soap and water, use hand sanitizer.  Follow instructions from your health care provider about how to take care of your puncture site. This includes: ? When and how to change your dressing. ? When to remove your dressing.  Check your puncture site every day for signs of infection. Check for: ? Redness, swelling, or pain. ? Fluid or blood. ? Pus or a bad smell. ? Warmth. General instructions  Return to your normal activities as told by your health care provider. Ask your health care provider what activities are safe for you.  Do not take baths, swim, or use a hot tub until your health care provider approves. Ask your health care provider if you may take showers. You may only be allowed to take sponge baths.  Take over-the-counter and prescription medicines only as told by your health care provider.  Keep all follow-up visits as told by your health care provider. This is important. Contact a health care provider if:  You have a fever.  You have redness, swelling, or pain at the puncture site that lasts longer than a few days.  You have fluid, blood, or pus coming from your puncture site.  Your puncture site feels warm to the touch. Get help right away if:  You have severe bleeding from the puncture site. Summary  After the procedure, it is common to have soreness, bruising, or mild pain at the puncture  site. This should go away in a few days.  Check your puncture site every day for signs of infection, such as redness, swelling, or pain.  Get help right away if you have severe bleeding from your puncture site. This information is not intended to replace advice given to you by your health care provider. Make sure you discuss any questions you have with your health care provider. Document Revised: 11/23/2017 Document Reviewed: 09/24/2017 Elsevier Patient Education  2020 [employer]  

## 2020-06-29 LAB — SURGICAL PATHOLOGY

## 2020-09-08 ENCOUNTER — Ambulatory Visit: Admit: 2020-09-08 | Payer: BC Managed Care – PPO | Admitting: Otolaryngology

## 2020-09-08 SURGERY — EXCISION, PAROTID GLAND
Anesthesia: General | Laterality: Left

## 2021-10-26 ENCOUNTER — Telehealth: Payer: Self-pay | Admitting: Acute Care

## 2021-10-26 NOTE — Telephone Encounter (Signed)
Left voicemail with call back number to schedule annual LDCT

## 2024-05-05 DIAGNOSIS — I1A0 Resistant hypertension: Secondary | ICD-10-CM | POA: Diagnosis not present

## 2024-05-05 DIAGNOSIS — I10 Essential (primary) hypertension: Secondary | ICD-10-CM | POA: Diagnosis not present

## 2024-05-05 DIAGNOSIS — G4733 Obstructive sleep apnea (adult) (pediatric): Secondary | ICD-10-CM | POA: Diagnosis not present

## 2024-05-05 DIAGNOSIS — I2584 Coronary atherosclerosis due to calcified coronary lesion: Secondary | ICD-10-CM | POA: Diagnosis not present

## 2024-05-05 DIAGNOSIS — I517 Cardiomegaly: Secondary | ICD-10-CM | POA: Diagnosis not present

## 2024-05-05 DIAGNOSIS — I2510 Atherosclerotic heart disease of native coronary artery without angina pectoris: Secondary | ICD-10-CM | POA: Diagnosis not present

## 2024-05-05 DIAGNOSIS — E7849 Other hyperlipidemia: Secondary | ICD-10-CM | POA: Diagnosis not present

## 2024-05-05 DIAGNOSIS — Z951 Presence of aortocoronary bypass graft: Secondary | ICD-10-CM | POA: Diagnosis not present

## 2024-05-05 DIAGNOSIS — E1100 Type 2 diabetes mellitus with hyperosmolarity without nonketotic hyperglycemic-hyperosmolar coma (NKHHC): Secondary | ICD-10-CM | POA: Diagnosis not present
